# Patient Record
Sex: Female | Born: 1935 | Race: White | Hispanic: No | Marital: Married | Smoking: Former smoker
Health system: Southern US, Community
[De-identification: ages and names within clinical notes are randomized; demographics above are authoritative.]

## PROBLEM LIST (undated history)

## (undated) DIAGNOSIS — C189 Malignant neoplasm of colon, unspecified: Secondary | ICD-10-CM

## (undated) DIAGNOSIS — C8370 Burkitt lymphoma, unspecified site: Secondary | ICD-10-CM

## (undated) DIAGNOSIS — I2510 Atherosclerotic heart disease of native coronary artery without angina pectoris: Secondary | ICD-10-CM

## (undated) DIAGNOSIS — E785 Hyperlipidemia, unspecified: Secondary | ICD-10-CM

## (undated) DIAGNOSIS — I219 Acute myocardial infarction, unspecified: Secondary | ICD-10-CM

## (undated) DIAGNOSIS — I10 Essential (primary) hypertension: Secondary | ICD-10-CM

## (undated) DIAGNOSIS — Z9221 Personal history of antineoplastic chemotherapy: Secondary | ICD-10-CM

## (undated) DIAGNOSIS — C801 Malignant (primary) neoplasm, unspecified: Secondary | ICD-10-CM

## (undated) HISTORY — DX: Essential (primary) hypertension: I10

## (undated) HISTORY — DX: Malignant (primary) neoplasm, unspecified: C80.1

## (undated) HISTORY — DX: Hyperlipidemia, unspecified: E78.5

## (undated) HISTORY — DX: Personal history of antineoplastic chemotherapy: Z92.21

## (undated) HISTORY — PX: VAGINAL HYSTERECTOMY: SUR661

## (undated) HISTORY — DX: Burkitt lymphoma, unspecified site: C83.70

## (undated) HISTORY — DX: Atherosclerotic heart disease of native coronary artery without angina pectoris: I25.10

## (undated) HISTORY — DX: Acute myocardial infarction, unspecified: I21.9

## (undated) HISTORY — PX: PARTIAL COLECTOMY: SHX5273

## (undated) HISTORY — PX: LAPAROSCOPY: SHX197

## (undated) HISTORY — DX: Malignant neoplasm of colon, unspecified: C18.9

---

## 1998-04-17 ENCOUNTER — Other Ambulatory Visit: Admission: RE | Admit: 1998-04-17 | Discharge: 1998-04-17 | Payer: Self-pay | Admitting: Internal Medicine

## 1999-04-21 ENCOUNTER — Other Ambulatory Visit: Admission: RE | Admit: 1999-04-21 | Discharge: 1999-04-21 | Payer: Self-pay | Admitting: Internal Medicine

## 1999-12-19 ENCOUNTER — Other Ambulatory Visit: Admission: RE | Admit: 1999-12-19 | Discharge: 1999-12-19 | Payer: Self-pay | Admitting: Oncology

## 2000-04-28 ENCOUNTER — Other Ambulatory Visit: Admission: RE | Admit: 2000-04-28 | Discharge: 2000-04-28 | Payer: Self-pay | Admitting: Internal Medicine

## 2001-04-20 ENCOUNTER — Encounter: Admission: RE | Admit: 2001-04-20 | Discharge: 2001-04-20 | Payer: Self-pay | Admitting: Orthopedic Surgery

## 2001-04-20 ENCOUNTER — Encounter: Payer: Self-pay | Admitting: Orthopedic Surgery

## 2001-04-22 ENCOUNTER — Ambulatory Visit (HOSPITAL_BASED_OUTPATIENT_CLINIC_OR_DEPARTMENT_OTHER): Admission: RE | Admit: 2001-04-22 | Discharge: 2001-04-23 | Payer: Self-pay | Admitting: Orthopedic Surgery

## 2001-06-28 ENCOUNTER — Encounter: Admission: RE | Admit: 2001-06-28 | Discharge: 2001-07-28 | Payer: Self-pay | Admitting: Orthopedic Surgery

## 2002-09-29 ENCOUNTER — Ambulatory Visit (HOSPITAL_COMMUNITY): Admission: RE | Admit: 2002-09-29 | Discharge: 2002-09-29 | Payer: Self-pay | Admitting: Oncology

## 2002-09-29 ENCOUNTER — Encounter: Payer: Self-pay | Admitting: Oncology

## 2003-07-24 ENCOUNTER — Ambulatory Visit (HOSPITAL_COMMUNITY): Admission: RE | Admit: 2003-07-24 | Discharge: 2003-07-24 | Payer: Self-pay | Admitting: Oncology

## 2003-07-24 ENCOUNTER — Encounter: Payer: Self-pay | Admitting: Oncology

## 2003-08-13 ENCOUNTER — Encounter: Payer: Self-pay | Admitting: Oncology

## 2003-08-13 ENCOUNTER — Inpatient Hospital Stay (HOSPITAL_COMMUNITY): Admission: AD | Admit: 2003-08-13 | Discharge: 2003-08-24 | Payer: Self-pay | Admitting: Oncology

## 2003-08-14 ENCOUNTER — Encounter: Payer: Self-pay | Admitting: Oncology

## 2003-08-16 ENCOUNTER — Encounter: Payer: Self-pay | Admitting: Oncology

## 2003-08-17 ENCOUNTER — Encounter: Payer: Self-pay | Admitting: Oncology

## 2003-08-18 ENCOUNTER — Encounter: Payer: Self-pay | Admitting: Gastroenterology

## 2003-08-21 ENCOUNTER — Encounter: Payer: Self-pay | Admitting: Gastroenterology

## 2003-08-21 ENCOUNTER — Encounter: Payer: Self-pay | Admitting: Oncology

## 2003-08-23 ENCOUNTER — Encounter: Payer: Self-pay | Admitting: Oncology

## 2003-09-11 ENCOUNTER — Ambulatory Visit (HOSPITAL_COMMUNITY): Admission: RE | Admit: 2003-09-11 | Discharge: 2003-09-11 | Payer: Self-pay

## 2003-09-12 ENCOUNTER — Encounter: Payer: Self-pay | Admitting: Oncology

## 2003-09-12 ENCOUNTER — Inpatient Hospital Stay (HOSPITAL_COMMUNITY): Admission: EM | Admit: 2003-09-12 | Discharge: 2003-09-14 | Payer: Self-pay | Admitting: Oncology

## 2003-09-12 ENCOUNTER — Encounter: Payer: Self-pay | Admitting: Surgery

## 2003-09-15 ENCOUNTER — Inpatient Hospital Stay (HOSPITAL_COMMUNITY): Admission: RE | Admit: 2003-09-15 | Discharge: 2003-09-18 | Payer: Self-pay | Admitting: Interventional Radiology

## 2003-09-15 ENCOUNTER — Encounter: Payer: Self-pay | Admitting: General Surgery

## 2003-09-15 ENCOUNTER — Ambulatory Visit (HOSPITAL_COMMUNITY): Admission: RE | Admit: 2003-09-15 | Discharge: 2003-09-15 | Payer: Self-pay

## 2003-09-16 ENCOUNTER — Encounter: Payer: Self-pay | Admitting: General Surgery

## 2003-09-17 ENCOUNTER — Encounter: Payer: Self-pay | Admitting: General Surgery

## 2003-10-31 ENCOUNTER — Ambulatory Visit (HOSPITAL_COMMUNITY): Admission: RE | Admit: 2003-10-31 | Discharge: 2003-10-31 | Payer: Self-pay | Admitting: Oncology

## 2004-07-07 ENCOUNTER — Emergency Department (HOSPITAL_COMMUNITY): Admission: EM | Admit: 2004-07-07 | Discharge: 2004-07-07 | Payer: Self-pay | Admitting: Family Medicine

## 2004-10-22 ENCOUNTER — Ambulatory Visit: Payer: Self-pay | Admitting: Oncology

## 2005-01-02 ENCOUNTER — Ambulatory Visit: Payer: Self-pay | Admitting: Oncology

## 2005-03-06 ENCOUNTER — Ambulatory Visit: Payer: Self-pay | Admitting: Oncology

## 2005-06-15 ENCOUNTER — Ambulatory Visit: Payer: Self-pay | Admitting: Oncology

## 2005-08-24 ENCOUNTER — Ambulatory Visit: Payer: Self-pay | Admitting: Oncology

## 2006-01-05 ENCOUNTER — Ambulatory Visit: Payer: Self-pay | Admitting: Oncology

## 2006-03-16 ENCOUNTER — Ambulatory Visit: Payer: Self-pay | Admitting: Oncology

## 2006-03-16 LAB — COMPREHENSIVE METABOLIC PANEL
AST: 19 U/L (ref 0–37)
BUN: 32 mg/dL — ABNORMAL HIGH (ref 6–23)
CO2: 21 mEq/L (ref 19–32)
Calcium: 9.6 mg/dL (ref 8.4–10.5)
Chloride: 105 mEq/L (ref 96–112)
Creatinine, Ser: 0.9 mg/dL (ref 0.4–1.2)
Glucose, Bld: 111 mg/dL — ABNORMAL HIGH (ref 70–99)

## 2006-03-16 LAB — CBC WITH DIFFERENTIAL/PLATELET
Basophils Absolute: 0 10*3/uL (ref 0.0–0.1)
EOS%: 3.7 % (ref 0.0–7.0)
HGB: 13.5 g/dL (ref 11.6–15.9)
MCH: 32.5 pg (ref 26.0–34.0)
MCV: 96.1 fL (ref 81.0–101.0)
MONO%: 7.8 % (ref 0.0–13.0)
RBC: 4.15 10*6/uL (ref 3.70–5.32)
RDW: 13.5 % (ref 11.3–14.5)

## 2006-03-16 LAB — MORPHOLOGY

## 2006-06-08 ENCOUNTER — Ambulatory Visit: Payer: Self-pay | Admitting: Oncology

## 2006-06-14 LAB — CBC WITH DIFFERENTIAL/PLATELET
Basophils Absolute: 0 10*3/uL (ref 0.0–0.1)
EOS%: 2.5 % (ref 0.0–7.0)
Eosinophils Absolute: 0.1 10*3/uL (ref 0.0–0.5)
HGB: 14 g/dL (ref 11.6–15.9)
LYMPH%: 23.5 % (ref 14.0–48.0)
MCH: 33.7 pg (ref 26.0–34.0)
MCV: 97.4 fL (ref 81.0–101.0)
MONO%: 6.4 % (ref 0.0–13.0)
NEUT%: 67 % (ref 39.6–76.8)
Platelets: 246 10*3/uL (ref 145–400)
RDW: 13.2 % (ref 11.3–14.5)

## 2006-06-14 LAB — COMPREHENSIVE METABOLIC PANEL
Albumin: 4.6 g/dL (ref 3.5–5.2)
BUN: 23 mg/dL (ref 6–23)
CO2: 26 mEq/L (ref 19–32)
Calcium: 9.9 mg/dL (ref 8.4–10.5)
Chloride: 102 mEq/L (ref 96–112)
Creatinine, Ser: 0.88 mg/dL (ref 0.40–1.20)
Glucose, Bld: 87 mg/dL (ref 70–99)
Potassium: 4.3 mEq/L (ref 3.5–5.3)

## 2006-06-14 LAB — MORPHOLOGY

## 2006-07-28 ENCOUNTER — Ambulatory Visit: Payer: Self-pay | Admitting: Oncology

## 2006-08-02 LAB — MORPHOLOGY

## 2006-08-02 LAB — CBC WITH DIFFERENTIAL/PLATELET
BASO%: 0.4 % (ref 0.0–2.0)
EOS%: 3.6 % (ref 0.0–7.0)
MCH: 33.1 pg (ref 26.0–34.0)
MCHC: 33.9 g/dL (ref 32.0–36.0)
MONO#: 0.4 10*3/uL (ref 0.1–0.9)
NEUT%: 62.1 % (ref 39.6–76.8)
RBC: 4.03 10*6/uL (ref 3.70–5.32)
RDW: 13.6 % (ref 11.3–14.5)
WBC: 4.6 10*3/uL (ref 3.9–10.0)
lymph#: 1.2 10*3/uL (ref 0.9–3.3)

## 2006-08-02 LAB — COMPREHENSIVE METABOLIC PANEL
ALT: <8 U/L (ref 0–40)
AST: 15 U/L (ref 0–37)
Albumin: 4.3 g/dL (ref 3.5–5.2)
Alkaline Phosphatase: 48 U/L (ref 39–117)
Calcium: 9.7 mg/dL (ref 8.4–10.5)
Chloride: 105 mEq/L (ref 96–112)
Creatinine, Ser: 0.96 mg/dL (ref 0.40–1.20)
Potassium: 4.4 mEq/L (ref 3.5–5.3)

## 2006-09-24 ENCOUNTER — Ambulatory Visit: Payer: Self-pay | Admitting: Oncology

## 2006-09-28 LAB — COMPREHENSIVE METABOLIC PANEL
ALT: 13 U/L (ref 0–40)
Albumin: 4.2 g/dL (ref 3.5–5.2)
CO2: 21 mEq/L (ref 19–32)
Calcium: 9.2 mg/dL (ref 8.4–10.5)
Chloride: 107 mEq/L (ref 96–112)
Glucose, Bld: 109 mg/dL — ABNORMAL HIGH (ref 70–99)
Sodium: 138 mEq/L (ref 135–145)
Total Protein: 6 g/dL (ref 6.0–8.3)

## 2006-09-28 LAB — CBC WITH DIFFERENTIAL/PLATELET
Eosinophils Absolute: 0.2 10*3/uL (ref 0.0–0.5)
HCT: 37.7 % (ref 34.8–46.6)
LYMPH%: 20.8 % (ref 14.0–48.0)
MCHC: 35 g/dL (ref 32.0–36.0)
MCV: 93.6 fL (ref 81.0–101.0)
MONO#: 0.5 10*3/uL (ref 0.1–0.9)
MONO%: 8.1 % (ref 0.0–13.0)
NEUT#: 3.8 10*3/uL (ref 1.5–6.5)
NEUT%: 66.7 % (ref 39.6–76.8)
Platelets: 234 10*3/uL (ref 145–400)
RBC: 4.03 10*6/uL (ref 3.70–5.32)
WBC: 5.7 10*3/uL (ref 3.9–10.0)

## 2006-09-28 LAB — LACTATE DEHYDROGENASE: LDH: 133 U/L (ref 94–250)

## 2006-09-28 LAB — MORPHOLOGY: PLT EST: ADEQUATE

## 2006-10-18 LAB — CBC WITH DIFFERENTIAL/PLATELET
Eosinophils Absolute: 0.2 10*3/uL (ref 0.0–0.5)
MONO#: 0.4 10*3/uL (ref 0.1–0.9)
NEUT#: 4.5 10*3/uL (ref 1.5–6.5)
RBC: 4.02 10*6/uL (ref 3.70–5.32)
RDW: 12.6 % (ref 11.3–14.5)
WBC: 6.5 10*3/uL (ref 3.9–10.0)

## 2006-10-18 LAB — MORPHOLOGY: PLT EST: ADEQUATE

## 2006-12-14 HISTORY — PX: ANGIOPLASTY: SHX39

## 2006-12-29 ENCOUNTER — Ambulatory Visit: Payer: Self-pay | Admitting: Oncology

## 2006-12-31 ENCOUNTER — Inpatient Hospital Stay (HOSPITAL_COMMUNITY): Admission: EM | Admit: 2006-12-31 | Discharge: 2007-01-02 | Payer: Self-pay | Admitting: Emergency Medicine

## 2006-12-31 ENCOUNTER — Ambulatory Visit: Payer: Self-pay | Admitting: Cardiology

## 2007-01-12 ENCOUNTER — Ambulatory Visit: Payer: Self-pay | Admitting: Cardiology

## 2007-01-13 ENCOUNTER — Encounter (HOSPITAL_COMMUNITY): Admission: RE | Admit: 2007-01-13 | Discharge: 2007-04-13 | Payer: Self-pay | Admitting: Cardiology

## 2007-02-02 LAB — COMPREHENSIVE METABOLIC PANEL
BUN: 13 mg/dL (ref 6–23)
CO2: 23 mEq/L (ref 19–32)
Calcium: 9.1 mg/dL (ref 8.4–10.5)
Chloride: 108 mEq/L (ref 96–112)
Creatinine, Ser: 0.79 mg/dL (ref 0.40–1.20)
Glucose, Bld: 126 mg/dL — ABNORMAL HIGH (ref 70–99)
Total Bilirubin: 0.6 mg/dL (ref 0.3–1.2)

## 2007-02-02 LAB — CBC WITH DIFFERENTIAL/PLATELET
Basophils Absolute: 0 10*3/uL (ref 0.0–0.1)
Eosinophils Absolute: 0.2 10*3/uL (ref 0.0–0.5)
HCT: 35.4 % (ref 34.8–46.6)
HGB: 12 g/dL (ref 11.6–15.9)
MONO#: 0.4 10*3/uL (ref 0.1–0.9)
NEUT#: 4.2 10*3/uL (ref 1.5–6.5)
NEUT%: 69.8 % (ref 39.6–76.8)
WBC: 6 10*3/uL (ref 3.9–10.0)
lymph#: 1.1 10*3/uL (ref 0.9–3.3)

## 2007-02-02 LAB — MORPHOLOGY: PLT EST: ADEQUATE

## 2007-02-02 LAB — LACTATE DEHYDROGENASE: LDH: 96 U/L (ref 94–250)

## 2007-02-22 ENCOUNTER — Ambulatory Visit: Payer: Self-pay

## 2007-02-22 ENCOUNTER — Encounter: Payer: Self-pay | Admitting: Cardiology

## 2007-02-22 ENCOUNTER — Ambulatory Visit: Payer: Self-pay | Admitting: Cardiology

## 2007-02-22 LAB — CONVERTED CEMR LAB
AST: 17 units/L (ref 0–37)
Bilirubin, Direct: 0.1 mg/dL (ref 0.0–0.3)
HDL: 42.2 mg/dL (ref >39.0)
LDL Cholesterol: 77 mg/dL (ref 0–99)
Total CHOL/HDL Ratio: 3.3
Triglycerides: 101 mg/dL (ref 0–149)
VLDL: 20 mg/dL (ref 0–40)

## 2007-02-24 ENCOUNTER — Ambulatory Visit: Payer: Self-pay | Admitting: Cardiology

## 2007-03-15 ENCOUNTER — Ambulatory Visit: Payer: Self-pay | Admitting: Oncology

## 2007-03-18 LAB — COMPREHENSIVE METABOLIC PANEL
ALT: 10 U/L (ref 0–35)
Alkaline Phosphatase: 68 U/L (ref 39–117)
CO2: 22 mEq/L (ref 19–32)
Creatinine, Ser: 0.77 mg/dL (ref 0.40–1.20)
Glucose, Bld: 97 mg/dL (ref 70–99)
Sodium: 140 mEq/L (ref 135–145)
Total Bilirubin: 0.4 mg/dL (ref 0.3–1.2)

## 2007-03-18 LAB — MORPHOLOGY: PLT EST: ADEQUATE

## 2007-03-18 LAB — CBC WITH DIFFERENTIAL/PLATELET
Basophils Absolute: 0 10*3/uL (ref 0.0–0.1)
EOS%: 3.5 % (ref 0.0–7.0)
HCT: 35.7 % (ref 34.8–46.6)
HGB: 12.2 g/dL (ref 11.6–15.9)
MCH: 30.2 pg (ref 26.0–34.0)
MONO#: 0.5 10*3/uL (ref 0.1–0.9)
NEUT#: 4.8 10*3/uL (ref 1.5–6.5)
NEUT%: 73.5 % (ref 39.6–76.8)
RDW: 13.3 % (ref 11.3–14.5)
WBC: 6.6 10*3/uL (ref 3.9–10.0)
lymph#: 1 10*3/uL (ref 0.9–3.3)

## 2007-03-18 LAB — LACTATE DEHYDROGENASE: LDH: 116 U/L (ref 94–250)

## 2007-04-14 ENCOUNTER — Encounter (HOSPITAL_COMMUNITY): Admission: RE | Admit: 2007-04-14 | Discharge: 2007-04-29 | Payer: Self-pay | Admitting: Cardiology

## 2007-06-27 ENCOUNTER — Ambulatory Visit: Payer: Self-pay

## 2007-06-30 ENCOUNTER — Ambulatory Visit: Payer: Self-pay | Admitting: Cardiology

## 2007-06-30 ENCOUNTER — Ambulatory Visit: Payer: Self-pay | Admitting: Oncology

## 2007-07-04 LAB — COMPREHENSIVE METABOLIC PANEL
Alkaline Phosphatase: 62 U/L (ref 39–117)
Creatinine, Ser: 0.81 mg/dL (ref 0.40–1.20)
Glucose, Bld: 111 mg/dL — ABNORMAL HIGH (ref 70–99)
Sodium: 142 mEq/L (ref 135–145)
Total Bilirubin: 0.5 mg/dL (ref 0.3–1.2)
Total Protein: 5.5 g/dL — ABNORMAL LOW (ref 6.0–8.3)

## 2007-07-04 LAB — CBC WITH DIFFERENTIAL/PLATELET
BASO%: 0.5 % (ref 0.0–2.0)
Basophils Absolute: 0 10*3/uL (ref 0.0–0.1)
HCT: 33.5 % — ABNORMAL LOW (ref 34.8–46.6)
HGB: 11.1 g/dL — ABNORMAL LOW (ref 11.6–15.9)
MCHC: 33.2 g/dL (ref 32.0–36.0)
MONO#: 0.5 10*3/uL (ref 0.1–0.9)
NEUT%: 73.6 % (ref 39.6–76.8)
RDW: 15.8 % — ABNORMAL HIGH (ref 11.3–14.5)
WBC: 7.3 10*3/uL (ref 3.9–10.0)
lymph#: 1.1 10*3/uL (ref 0.9–3.3)

## 2007-07-04 LAB — MORPHOLOGY

## 2007-07-12 LAB — CBC WITH DIFFERENTIAL/PLATELET
Basophils Absolute: 0.1 10*3/uL (ref 0.0–0.1)
Eosinophils Absolute: 0.3 10*3/uL (ref 0.0–0.5)
HCT: 33 % — ABNORMAL LOW (ref 34.8–46.6)
HGB: 10.7 g/dL — ABNORMAL LOW (ref 11.6–15.9)
LYMPH%: 17.3 % (ref 14.0–48.0)
MONO#: 0.6 10*3/uL (ref 0.1–0.9)
NEUT#: 5.7 10*3/uL (ref 1.5–6.5)
NEUT%: 70.6 % (ref 39.6–76.8)
Platelets: 329 10*3/uL (ref 145–400)
WBC: 8.1 10*3/uL (ref 3.9–10.0)
lymph#: 1.4 10*3/uL (ref 0.9–3.3)

## 2007-07-12 LAB — FECAL OCCULT BLOOD, GUAIAC

## 2007-07-14 ENCOUNTER — Ambulatory Visit: Payer: Self-pay | Admitting: Internal Medicine

## 2007-07-18 ENCOUNTER — Encounter: Payer: Self-pay | Admitting: Internal Medicine

## 2007-07-18 ENCOUNTER — Ambulatory Visit: Payer: Self-pay | Admitting: Internal Medicine

## 2007-07-18 DIAGNOSIS — Z85038 Personal history of other malignant neoplasm of large intestine: Secondary | ICD-10-CM | POA: Insufficient documentation

## 2007-07-22 ENCOUNTER — Encounter (INDEPENDENT_AMBULATORY_CARE_PROVIDER_SITE_OTHER): Payer: Self-pay | Admitting: General Surgery

## 2007-07-22 ENCOUNTER — Ambulatory Visit: Payer: Self-pay | Admitting: Cardiology

## 2007-07-22 ENCOUNTER — Inpatient Hospital Stay (HOSPITAL_COMMUNITY): Admission: RE | Admit: 2007-07-22 | Discharge: 2007-07-28 | Payer: Self-pay | Admitting: General Surgery

## 2007-08-25 ENCOUNTER — Ambulatory Visit: Payer: Self-pay | Admitting: Oncology

## 2007-08-29 LAB — CBC WITH DIFFERENTIAL/PLATELET
Basophils Absolute: 0 10*3/uL (ref 0.0–0.1)
EOS%: 4.4 % (ref 0.0–7.0)
HCT: 38 % (ref 34.8–46.6)
HGB: 12.9 g/dL (ref 11.6–15.9)
MCH: 28.9 pg (ref 26.0–34.0)
MCV: 85.3 fL (ref 81.0–101.0)
NEUT%: 55.1 % (ref 39.6–76.8)
lymph#: 1.9 10*3/uL (ref 0.9–3.3)

## 2007-08-29 LAB — IRON AND TIBC
%SAT: 14 % — ABNORMAL LOW (ref 20–55)
UIBC: 280 ug/dL

## 2007-08-29 LAB — COMPREHENSIVE METABOLIC PANEL
AST: 16 U/L (ref 0–37)
BUN: 16 mg/dL (ref 6–23)
Calcium: 9.5 mg/dL (ref 8.4–10.5)
Chloride: 112 mEq/L (ref 96–112)
Creatinine, Ser: 0.82 mg/dL (ref 0.40–1.20)
Glucose, Bld: 87 mg/dL (ref 70–99)

## 2007-08-29 LAB — LACTATE DEHYDROGENASE: LDH: 132 U/L (ref 94–250)

## 2007-08-29 LAB — CEA: CEA: 0.6 ng/mL (ref 0.0–5.0)

## 2007-08-29 LAB — FERRITIN: Ferritin: 33 ng/mL (ref 10–291)

## 2007-09-06 LAB — CBC WITH DIFFERENTIAL/PLATELET
BASO%: 1.1 % (ref 0.0–2.0)
EOS%: 3.3 % (ref 0.0–7.0)
HCT: 39.2 % (ref 34.8–46.6)
MCHC: 32.7 g/dL (ref 32.0–36.0)
MONO#: 0.5 10*3/uL (ref 0.1–0.9)
NEUT%: 70.8 % (ref 39.6–76.8)
RBC: 4.51 10*6/uL (ref 3.70–5.32)
RDW: 16.3 % — ABNORMAL HIGH (ref 11.3–14.5)
WBC: 7.5 10*3/uL (ref 3.9–10.0)
lymph#: 1.4 10*3/uL (ref 0.9–3.3)

## 2007-09-12 LAB — CBC WITH DIFFERENTIAL/PLATELET
Basophils Absolute: 0.1 10*3/uL (ref 0.0–0.1)
Eosinophils Absolute: 0.2 10*3/uL (ref 0.0–0.5)
HCT: 38.2 % (ref 34.8–46.6)
HGB: 12.7 g/dL (ref 11.6–15.9)
MONO#: 0.7 10*3/uL (ref 0.1–0.9)
NEUT#: 4.9 10*3/uL (ref 1.5–6.5)
NEUT%: 68 % (ref 39.6–76.8)
RDW: 18 % — ABNORMAL HIGH (ref 11.3–14.5)
WBC: 7.2 10*3/uL (ref 3.9–10.0)
lymph#: 1.4 10*3/uL (ref 0.9–3.3)

## 2007-09-20 LAB — CBC WITH DIFFERENTIAL/PLATELET
Basophils Absolute: 0 10*3/uL (ref 0.0–0.1)
EOS%: 3 % (ref 0.0–7.0)
Eosinophils Absolute: 0.2 10*3/uL (ref 0.0–0.5)
HCT: 35.6 % (ref 34.8–46.6)
HGB: 12.1 g/dL (ref 11.6–15.9)
MCH: 30.1 pg (ref 26.0–34.0)
MCV: 88.3 fL (ref 81.0–101.0)
MONO%: 6.9 % (ref 0.0–13.0)
NEUT#: 4.4 10*3/uL (ref 1.5–6.5)
NEUT%: 72 % (ref 39.6–76.8)
RDW: 22.9 % — ABNORMAL HIGH (ref 11.3–14.5)

## 2007-09-20 LAB — COMPREHENSIVE METABOLIC PANEL
AST: 16 U/L (ref 0–37)
Albumin: 3.9 g/dL (ref 3.5–5.2)
Alkaline Phosphatase: 58 U/L (ref 39–117)
BUN: 11 mg/dL (ref 6–23)
Calcium: 8.9 mg/dL (ref 8.4–10.5)
Chloride: 107 mEq/L (ref 96–112)
Creatinine, Ser: 0.77 mg/dL (ref 0.40–1.20)
Glucose, Bld: 104 mg/dL — ABNORMAL HIGH (ref 70–99)
Potassium: 3.9 mEq/L (ref 3.5–5.3)

## 2007-10-12 ENCOUNTER — Ambulatory Visit: Payer: Self-pay | Admitting: Oncology

## 2007-10-14 LAB — COMPREHENSIVE METABOLIC PANEL
ALT: 24 U/L (ref 0–35)
Albumin: 4.3 g/dL (ref 3.5–5.2)
CO2: 21 mEq/L (ref 19–32)
Calcium: 9.6 mg/dL (ref 8.4–10.5)
Chloride: 111 mEq/L (ref 96–112)
Creatinine, Ser: 0.73 mg/dL (ref 0.40–1.20)
Sodium: 144 mEq/L (ref 135–145)
Total Protein: 6.1 g/dL (ref 6.0–8.3)

## 2007-10-14 LAB — CBC WITH DIFFERENTIAL/PLATELET
BASO%: 0.5 % (ref 0.0–2.0)
HCT: 39.4 % (ref 34.8–46.6)
HGB: 13.5 g/dL (ref 11.6–15.9)
MCHC: 34.2 g/dL (ref 32.0–36.0)
MONO#: 0.3 10*3/uL (ref 0.1–0.9)
NEUT%: 66.7 % (ref 39.6–76.8)
WBC: 5.5 10*3/uL (ref 3.9–10.0)
lymph#: 1.2 10*3/uL (ref 0.9–3.3)

## 2007-10-24 LAB — CBC WITH DIFFERENTIAL/PLATELET
BASO%: 1.2 % (ref 0.0–2.0)
MCHC: 33.5 g/dL (ref 32.0–36.0)
MONO#: 0.4 10*3/uL (ref 0.1–0.9)
RBC: 4.35 10*6/uL (ref 3.70–5.32)
RDW: 16.5 % — ABNORMAL HIGH (ref 11.3–14.5)
WBC: 5.4 10*3/uL (ref 3.9–10.0)
lymph#: 1.4 10*3/uL (ref 0.9–3.3)

## 2007-11-01 IMAGING — CR DG CHEST 2V
2 series · 2 of 2 positions shown · non-contrast
Comparison: 12/31/06.

CLINICAL DATA: 78 year-old transverse colon cancer, preop for colectomy.
 CHEST - 2 VIEW:

[view not recorded (1 of 2)]
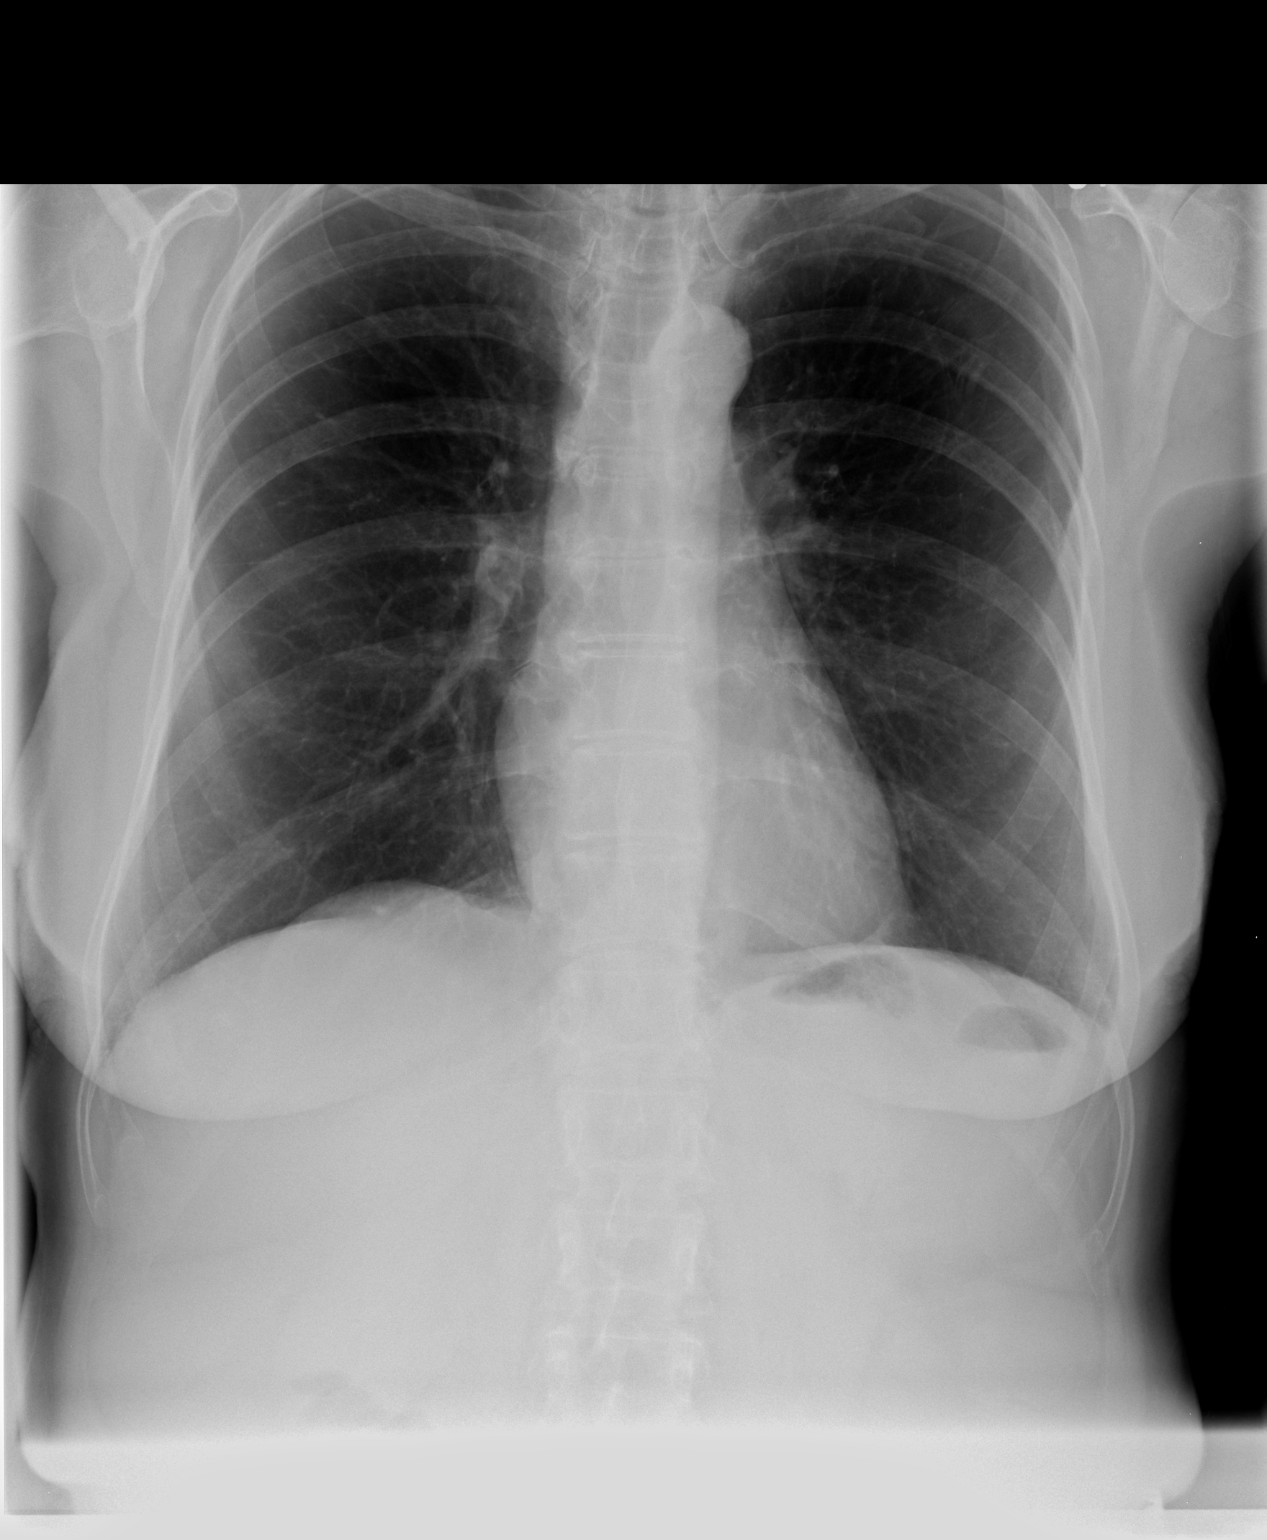

[view not recorded (2 of 2)]
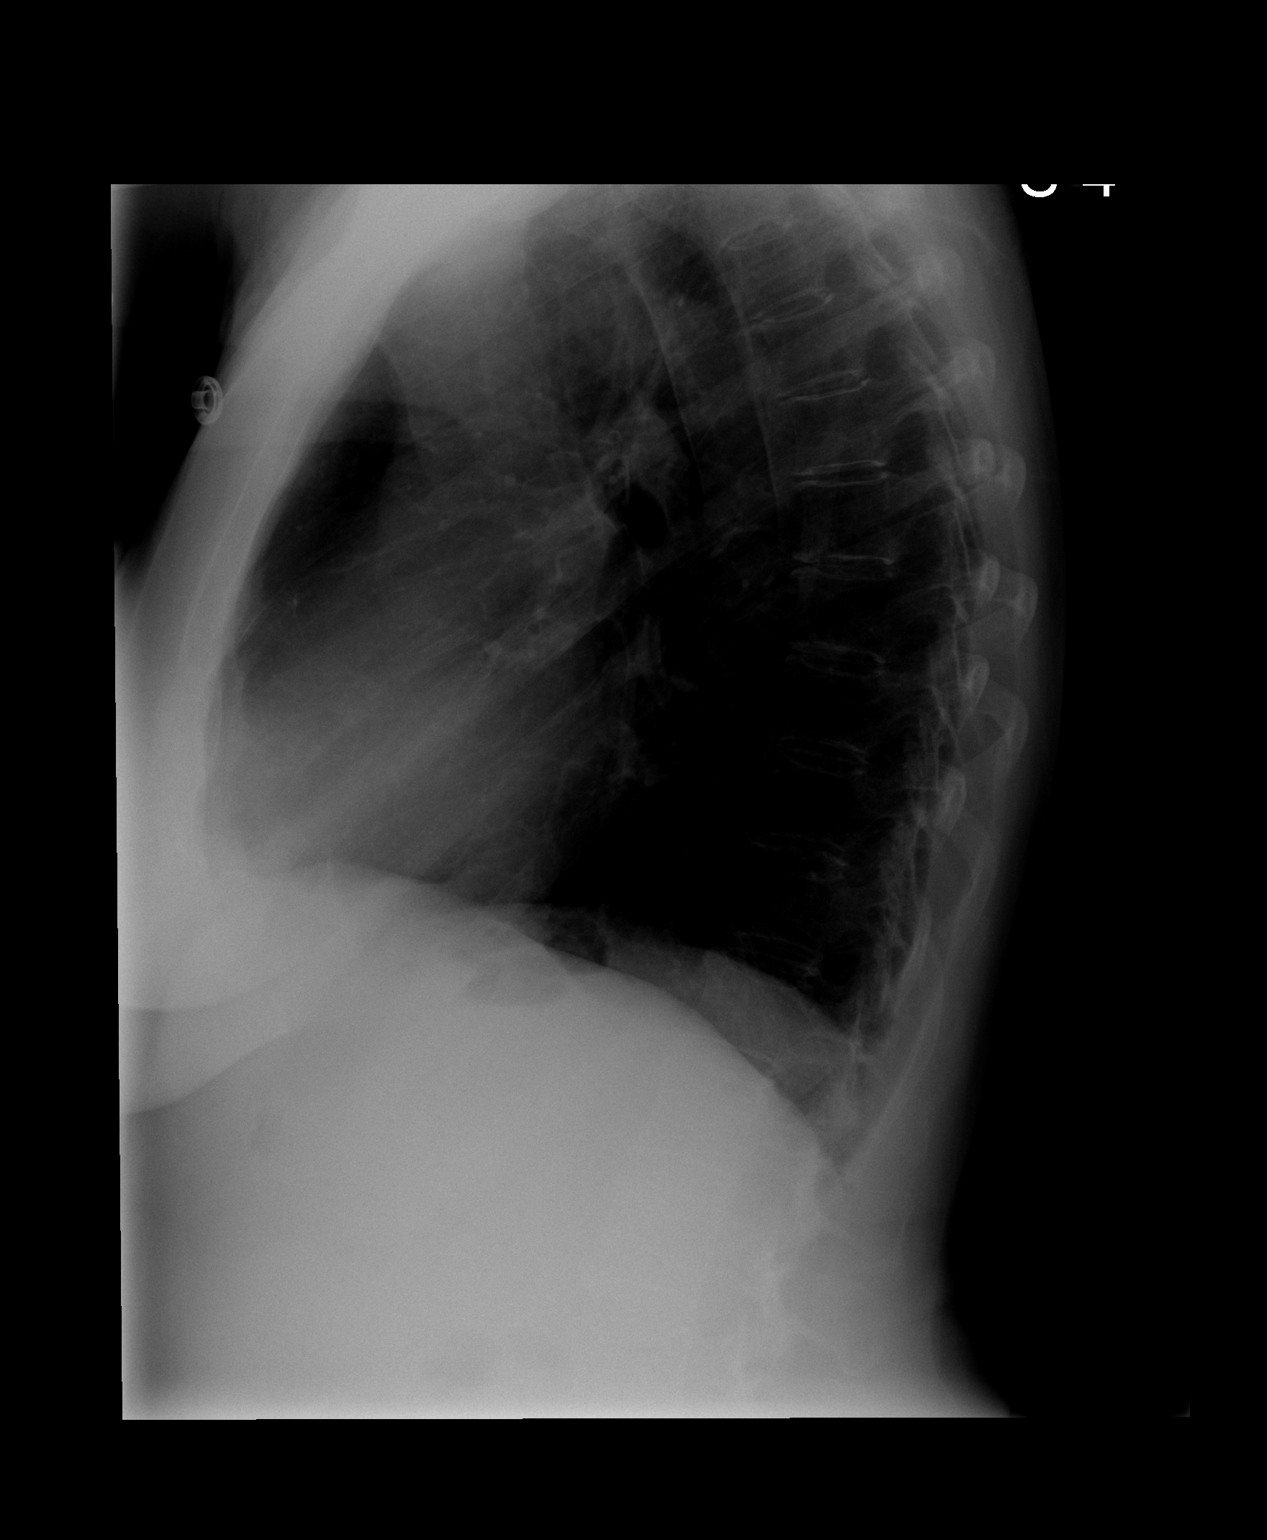

[2 of 2 positions shown; findings below may reference images not displayed]

FINDINGS: Cardiac silhouette, mediastinal and hilar contours are within normal limits and stable.  Mild changes of COPD.  Bony structures are unremarkable.
IMPRESSION: Mild stable changes of COPD.  No acute pulmonary findings, and no pulmonary masses or nodules.

## 2007-11-08 LAB — CBC WITH DIFFERENTIAL/PLATELET
BASO%: 1.7 % (ref 0.0–2.0)
Basophils Absolute: 0.1 10*3/uL (ref 0.0–0.1)
HCT: 42.2 % (ref 34.8–46.6)
HGB: 14.4 g/dL (ref 11.6–15.9)
MONO#: 0.4 10*3/uL (ref 0.1–0.9)
NEUT%: 53.5 % (ref 39.6–76.8)
RDW: 15.4 % — ABNORMAL HIGH (ref 11.3–14.5)
WBC: 5.2 10*3/uL (ref 3.9–10.0)
lymph#: 1.6 10*3/uL (ref 0.9–3.3)

## 2007-11-08 IMAGING — CR DG CHEST 1V PORT
1 series · 1 of 1 positions shown · non-contrast
Comparison: 07/24/07.

CLINICAL DATA: Congestion and cough.
 PORTABLE CHEST - 1 VIEW:

[view not recorded]
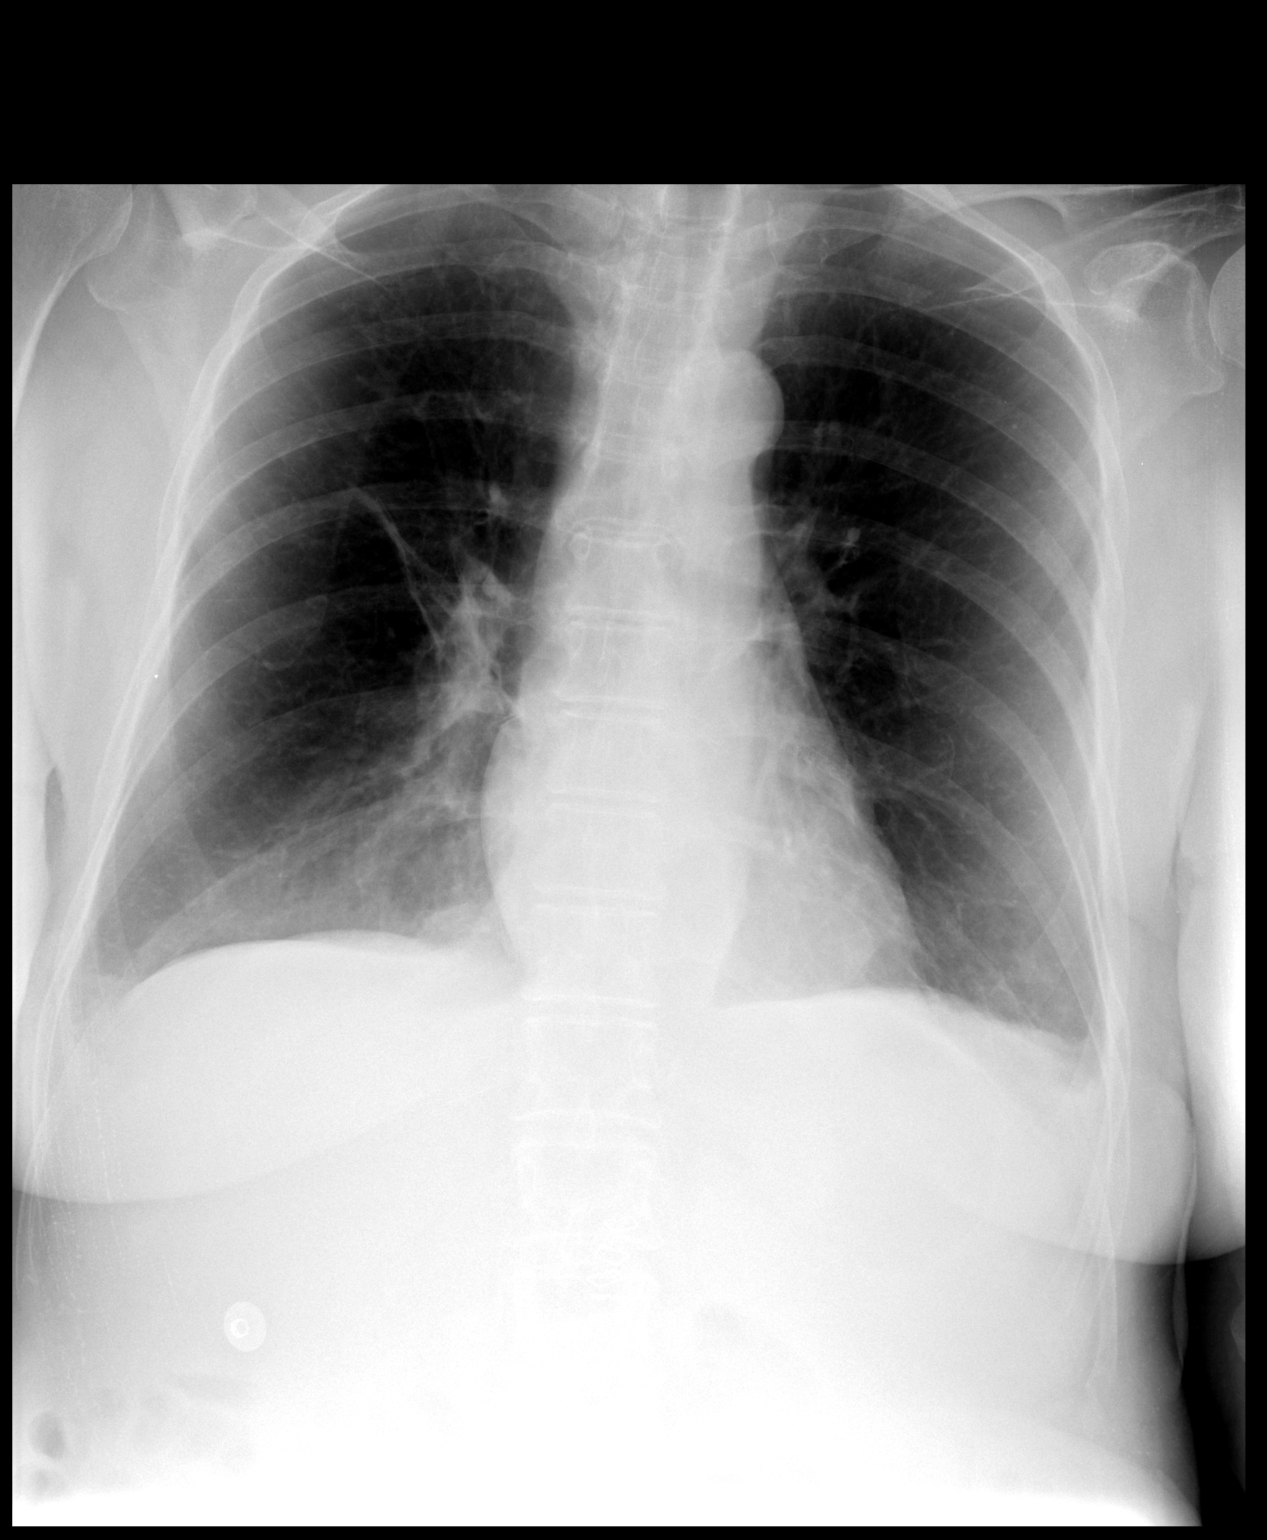

[1 of 1 positions shown; findings below may reference images not displayed]

FINDINGS: AP film at 9995 hours shows hyperinflation. The atelectasis or infiltrate at the right lung base persists.  There is perihilar atelectasis in the right mid lung. The left lung is clear. The Cardiopericardial silhouette is within normal limits for size. No evidence for pulmonary edema. Tiny bilateral pleural effusions are noted.
IMPRESSION: 1.  Hyperexpansion with right base atelectasis or infiltrate and tiny bilateral pleural effusions.
 2.  Right perihilar subsegmental atelectasis.

## 2007-11-22 LAB — CBC WITH DIFFERENTIAL/PLATELET
Basophils Absolute: 0.1 10*3/uL (ref 0.0–0.1)
EOS%: 5.1 % (ref 0.0–7.0)
Eosinophils Absolute: 0.2 10*3/uL (ref 0.0–0.5)
HCT: 40 % (ref 34.8–46.6)
HGB: 14.1 g/dL (ref 11.6–15.9)
MCH: 33.3 pg (ref 26.0–34.0)
MCV: 94.7 fL (ref 81.0–101.0)
NEUT#: 2.7 10*3/uL (ref 1.5–6.5)
NEUT%: 55.9 % (ref 39.6–76.8)
lymph#: 1.4 10*3/uL (ref 0.9–3.3)

## 2007-12-19 ENCOUNTER — Ambulatory Visit: Payer: Self-pay | Admitting: Oncology

## 2007-12-21 LAB — COMPREHENSIVE METABOLIC PANEL
ALT: 36 U/L — ABNORMAL HIGH (ref 0–35)
AST: 31 U/L (ref 0–37)
Calcium: 9.1 mg/dL (ref 8.4–10.5)
Chloride: 110 mEq/L (ref 96–112)
Creatinine, Ser: 0.8 mg/dL (ref 0.40–1.20)
Total Bilirubin: 0.6 mg/dL (ref 0.3–1.2)

## 2007-12-21 LAB — CBC WITH DIFFERENTIAL/PLATELET
BASO%: 2.2 % — ABNORMAL HIGH (ref 0.0–2.0)
EOS%: 5.8 % (ref 0.0–7.0)
HCT: 43.4 % (ref 34.8–46.6)
MCH: 34.3 pg — ABNORMAL HIGH (ref 26.0–34.0)
MCHC: 34.1 g/dL (ref 32.0–36.0)
NEUT%: 54.7 % (ref 39.6–76.8)
lymph#: 0.9 10*3/uL (ref 0.9–3.3)

## 2008-01-16 LAB — CBC WITH DIFFERENTIAL/PLATELET
BASO%: 1 % (ref 0.0–2.0)
Basophils Absolute: 0.1 10*3/uL (ref 0.0–0.1)
EOS%: 4.3 % (ref 0.0–7.0)
HGB: 14.2 g/dL (ref 11.6–15.9)
MCH: 35.3 pg — ABNORMAL HIGH (ref 26.0–34.0)
MCHC: 34.6 g/dL (ref 32.0–36.0)
MCV: 102.2 fL — ABNORMAL HIGH (ref 81.0–101.0)
MONO%: 9.5 % (ref 0.0–13.0)
RBC: 4.03 10*6/uL (ref 3.70–5.32)
RDW: 17.9 % — ABNORMAL HIGH (ref 11.3–14.5)
lymph#: 1.4 10*3/uL (ref 0.9–3.3)

## 2008-01-16 LAB — COMPREHENSIVE METABOLIC PANEL
ALT: 29 U/L (ref 0–35)
AST: 28 U/L (ref 0–37)
Albumin: 4.4 g/dL (ref 3.5–5.2)
Alkaline Phosphatase: 64 U/L (ref 39–117)
BUN: 17 mg/dL (ref 6–23)
Chloride: 109 mEq/L (ref 96–112)
Creatinine, Ser: 0.82 mg/dL (ref 0.40–1.20)
Potassium: 4.5 mEq/L (ref 3.5–5.3)

## 2008-01-17 ENCOUNTER — Ambulatory Visit: Payer: Self-pay | Admitting: Cardiology

## 2008-01-17 LAB — CONVERTED CEMR LAB
AST: 33 units/L (ref 0–37)
Albumin: 4.1 g/dL (ref 3.5–5.2)
Basophils Absolute: 0 10*3/uL (ref 0.0–0.1)
Chloride: 108 meq/L (ref 96–112)
Cholesterol: 166 mg/dL (ref 0–200)
Eosinophils Absolute: 0.2 10*3/uL (ref 0.0–0.6)
GFR calc Af Amer: 78 mL/min
GFR calc non Af Amer: 65 mL/min
HCT: 41.3 % (ref 36.0–46.0)
MCHC: 34.4 g/dL (ref 30.0–36.0)
MCV: 101.9 fL — ABNORMAL HIGH (ref 78.0–100.0)
Neutrophils Relative %: 54.9 % (ref 43.0–77.0)
Potassium: 4.2 meq/L (ref 3.5–5.1)
RBC: 4.05 M/uL (ref 3.87–5.11)
Sodium: 141 meq/L (ref 135–145)
Total CHOL/HDL Ratio: 3.2
Triglycerides: 91 mg/dL (ref 0–149)

## 2008-01-23 ENCOUNTER — Ambulatory Visit: Payer: Self-pay | Admitting: Cardiology

## 2008-02-06 ENCOUNTER — Ambulatory Visit: Payer: Self-pay | Admitting: Oncology

## 2008-02-06 LAB — COMPREHENSIVE METABOLIC PANEL
Albumin: 4.2 g/dL (ref 3.5–5.2)
Alkaline Phosphatase: 70 U/L (ref 39–117)
BUN: 11 mg/dL (ref 6–23)
Calcium: 8.9 mg/dL (ref 8.4–10.5)
Chloride: 106 mEq/L (ref 96–112)
Glucose, Bld: 129 mg/dL — ABNORMAL HIGH (ref 70–99)
Potassium: 4.1 mEq/L (ref 3.5–5.3)

## 2008-02-06 LAB — CBC WITH DIFFERENTIAL/PLATELET
Basophils Absolute: 0 10*3/uL (ref 0.0–0.1)
Eosinophils Absolute: 0.2 10*3/uL (ref 0.0–0.5)
HGB: 14.4 g/dL (ref 11.6–15.9)
MCV: 102.6 fL — ABNORMAL HIGH (ref 81.0–101.0)
MONO%: 10.6 % (ref 0.0–13.0)
NEUT#: 4.8 10*3/uL (ref 1.5–6.5)
RDW: 17.2 % — ABNORMAL HIGH (ref 11.3–14.5)

## 2008-03-12 LAB — CBC WITH DIFFERENTIAL/PLATELET
BASO%: 1.1 % (ref 0.0–2.0)
Basophils Absolute: 0.1 10*3/uL (ref 0.0–0.1)
EOS%: 6.2 % (ref 0.0–7.0)
HCT: 44.2 % (ref 34.8–46.6)
HGB: 15 g/dL (ref 11.6–15.9)
LYMPH%: 29.9 % (ref 14.0–48.0)
MCH: 35.8 pg — ABNORMAL HIGH (ref 26.0–34.0)
MCHC: 34 g/dL (ref 32.0–36.0)
MCV: 106 fL — ABNORMAL HIGH (ref 81.0–101.0)
MONO%: 8.4 % (ref 0.0–13.0)
NEUT%: 54.4 % (ref 39.6–76.8)
Platelets: 181 10*3/uL (ref 145–400)
lymph#: 1.6 10*3/uL (ref 0.9–3.3)

## 2008-03-22 LAB — CBC WITH DIFFERENTIAL/PLATELET
BASO%: 1.4 % (ref 0.0–2.0)
Basophils Absolute: 0.1 10*3/uL (ref 0.0–0.1)
HCT: 43 % (ref 34.8–46.6)
HGB: 14.8 g/dL (ref 11.6–15.9)
LYMPH%: 31.6 % (ref 14.0–48.0)
MCHC: 34.5 g/dL (ref 32.0–36.0)
MONO#: 0.5 10*3/uL (ref 0.1–0.9)
NEUT%: 54 % (ref 39.6–76.8)
Platelets: 206 10*3/uL (ref 145–400)
WBC: 5.2 10*3/uL (ref 3.9–10.0)
lymph#: 1.7 10*3/uL (ref 0.9–3.3)

## 2008-03-27 DIAGNOSIS — E785 Hyperlipidemia, unspecified: Secondary | ICD-10-CM

## 2008-03-27 DIAGNOSIS — I2510 Atherosclerotic heart disease of native coronary artery without angina pectoris: Secondary | ICD-10-CM | POA: Insufficient documentation

## 2008-03-27 DIAGNOSIS — I10 Essential (primary) hypertension: Secondary | ICD-10-CM | POA: Insufficient documentation

## 2008-03-27 DIAGNOSIS — Z9089 Acquired absence of other organs: Secondary | ICD-10-CM | POA: Insufficient documentation

## 2008-03-27 DIAGNOSIS — Z9861 Coronary angioplasty status: Secondary | ICD-10-CM

## 2008-03-27 DIAGNOSIS — C8589 Other specified types of non-Hodgkin lymphoma, extranodal and solid organ sites: Secondary | ICD-10-CM

## 2008-03-27 DIAGNOSIS — C8379 Burkitt lymphoma, extranodal and solid organ sites: Secondary | ICD-10-CM | POA: Insufficient documentation

## 2008-03-27 DIAGNOSIS — I219 Acute myocardial infarction, unspecified: Secondary | ICD-10-CM | POA: Insufficient documentation

## 2008-05-04 ENCOUNTER — Encounter: Admission: RE | Admit: 2008-05-04 | Discharge: 2008-05-04 | Payer: Self-pay | Admitting: Internal Medicine

## 2008-07-02 ENCOUNTER — Ambulatory Visit: Payer: Self-pay | Admitting: Oncology

## 2008-07-04 LAB — CBC WITH DIFFERENTIAL/PLATELET
BASO%: 0.7 % (ref 0.0–2.0)
EOS%: 4 % (ref 0.0–7.0)
MCH: 33.7 pg (ref 26.0–34.0)
MCHC: 34.7 g/dL (ref 32.0–36.0)
NEUT%: 60.7 % (ref 39.6–76.8)
RBC: 4.26 10*6/uL (ref 3.70–5.32)
RDW: 12.5 % (ref 11.3–14.5)
lymph#: 1.2 10*3/uL (ref 0.9–3.3)

## 2008-07-04 LAB — COMPREHENSIVE METABOLIC PANEL
ALT: 17 U/L (ref 0–35)
AST: 20 U/L (ref 0–37)
Calcium: 9.6 mg/dL (ref 8.4–10.5)
Chloride: 108 mEq/L (ref 96–112)
Creatinine, Ser: 0.88 mg/dL (ref 0.40–1.20)
Potassium: 4.5 mEq/L (ref 3.5–5.3)
Sodium: 141 mEq/L (ref 135–145)

## 2008-07-10 ENCOUNTER — Encounter: Payer: Self-pay | Admitting: Internal Medicine

## 2008-07-19 ENCOUNTER — Ambulatory Visit: Payer: Self-pay | Admitting: Internal Medicine

## 2008-08-09 ENCOUNTER — Encounter: Payer: Self-pay | Admitting: Internal Medicine

## 2008-08-09 ENCOUNTER — Ambulatory Visit: Payer: Self-pay | Admitting: Internal Medicine

## 2008-08-13 ENCOUNTER — Encounter: Payer: Self-pay | Admitting: Internal Medicine

## 2008-10-03 ENCOUNTER — Ambulatory Visit: Payer: Self-pay | Admitting: Oncology

## 2008-10-05 LAB — CBC WITH DIFFERENTIAL/PLATELET
BASO%: 1 % (ref 0.0–2.0)
EOS%: 4.7 % (ref 0.0–7.0)
HCT: 42.5 % (ref 34.8–46.6)
LYMPH%: 23.7 % (ref 14.0–48.0)
MCH: 33.3 pg (ref 26.0–34.0)
MCHC: 34.2 g/dL (ref 32.0–36.0)
MONO%: 6.3 % (ref 0.0–13.0)
NEUT%: 64.3 % (ref 39.6–76.8)
Platelets: 199 10*3/uL (ref 145–400)
RBC: 4.37 10*6/uL (ref 3.70–5.32)

## 2008-12-28 ENCOUNTER — Ambulatory Visit: Payer: Self-pay | Admitting: Oncology

## 2009-01-01 ENCOUNTER — Encounter: Payer: Self-pay | Admitting: Internal Medicine

## 2009-02-06 ENCOUNTER — Ambulatory Visit: Payer: Self-pay | Admitting: Cardiology

## 2009-02-06 LAB — CONVERTED CEMR LAB
ALT: 20 units/L (ref 0–35)
AST: 23 units/L (ref 0–37)
Basophils Absolute: 0.1 10*3/uL (ref 0.0–0.1)
Bilirubin, Direct: 0.2 mg/dL (ref 0.0–0.3)
CO2: 25 meq/L (ref 19–32)
Calcium: 9.5 mg/dL (ref 8.4–10.5)
Chloride: 108 meq/L (ref 96–112)
Cholesterol: 163 mg/dL (ref 0–200)
Eosinophils Absolute: 0.2 10*3/uL (ref 0.0–0.7)
GFR calc non Af Amer: 64 mL/min
HDL: 41.8 mg/dL (ref >39.0)
LDL Cholesterol: 101 mg/dL — ABNORMAL HIGH (ref 0–99)
Lymphocytes Relative: 27.1 % (ref 12.0–46.0)
MCHC: 34.7 g/dL (ref 30.0–36.0)
Neutrophils Relative %: 60.2 % (ref 43.0–77.0)
RDW: 12.1 % (ref 11.5–14.6)
Sodium: 141 meq/L (ref 135–145)
Total Bilirubin: 0.9 mg/dL (ref 0.3–1.2)
Triglycerides: 103 mg/dL (ref 0–149)
VLDL: 21 mg/dL (ref 0–40)

## 2009-02-11 ENCOUNTER — Ambulatory Visit: Payer: Self-pay | Admitting: Cardiology

## 2009-02-11 ENCOUNTER — Encounter: Payer: Self-pay | Admitting: Cardiology

## 2009-04-16 ENCOUNTER — Ambulatory Visit: Payer: Self-pay | Admitting: Cardiology

## 2009-04-18 LAB — CONVERTED CEMR LAB
ALT: 18 units/L (ref 0–35)
AST: 24 units/L (ref 0–37)
Bilirubin, Direct: 0.2 mg/dL (ref 0.0–0.3)
LDL Cholesterol: 80 mg/dL (ref 0–99)
Total CHOL/HDL Ratio: 3
Total Protein: 6.1 g/dL (ref 6.0–8.3)
Triglycerides: 85 mg/dL (ref 0.0–149.0)

## 2009-04-29 ENCOUNTER — Ambulatory Visit: Payer: Self-pay | Admitting: Oncology

## 2009-05-01 ENCOUNTER — Encounter: Payer: Self-pay | Admitting: Internal Medicine

## 2009-05-01 LAB — CBC WITH DIFFERENTIAL/PLATELET
Basophils Absolute: 0 10*3/uL (ref 0.0–0.1)
EOS%: 2.6 % (ref 0.0–7.0)
Eosinophils Absolute: 0.1 10*3/uL (ref 0.0–0.5)
HCT: 44.3 % (ref 34.8–46.6)
HGB: 14.9 g/dL (ref 11.6–15.9)
MCH: 33 pg (ref 25.1–34.0)
NEUT#: 3.7 10*3/uL (ref 1.5–6.5)
NEUT%: 66.7 % (ref 38.4–76.8)
RDW: 13.4 % (ref 11.2–14.5)
lymph#: 1.3 10*3/uL (ref 0.9–3.3)

## 2009-05-01 LAB — COMPREHENSIVE METABOLIC PANEL
AST: 18 U/L (ref 0–37)
Albumin: 4.6 g/dL (ref 3.5–5.2)
BUN: 21 mg/dL (ref 6–23)
CO2: 23 mEq/L (ref 19–32)
Calcium: 10 mg/dL (ref 8.4–10.5)
Chloride: 105 mEq/L (ref 96–112)
Creatinine, Ser: 0.97 mg/dL (ref 0.40–1.20)
Glucose, Bld: 121 mg/dL — ABNORMAL HIGH (ref 70–99)
Potassium: 4.8 mEq/L (ref 3.5–5.3)

## 2009-05-01 LAB — LACTATE DEHYDROGENASE: LDH: 139 U/L (ref 94–250)

## 2009-08-23 ENCOUNTER — Ambulatory Visit: Payer: Self-pay | Admitting: Oncology

## 2009-08-27 ENCOUNTER — Encounter: Payer: Self-pay | Admitting: Cardiology

## 2009-08-27 ENCOUNTER — Encounter: Payer: Self-pay | Admitting: Internal Medicine

## 2009-08-27 LAB — CBC WITH DIFFERENTIAL/PLATELET
BASO%: 0.7 % (ref 0.0–2.0)
EOS%: 4 % (ref 0.0–7.0)
HCT: 42.7 % (ref 34.8–46.6)
LYMPH%: 22.6 % (ref 14.0–49.7)
MCH: 32.9 pg (ref 25.1–34.0)
MCHC: 34 g/dL (ref 31.5–36.0)
MONO#: 0.3 10*3/uL (ref 0.1–0.9)
NEUT%: 67.9 % (ref 38.4–76.8)
RBC: 4.4 10*6/uL (ref 3.70–5.45)
WBC: 5.5 10*3/uL (ref 3.9–10.3)
lymph#: 1.2 10*3/uL (ref 0.9–3.3)

## 2009-08-27 LAB — COMPREHENSIVE METABOLIC PANEL
ALT: 20 U/L (ref 0–35)
Alkaline Phosphatase: 50 U/L (ref 39–117)
Creatinine, Ser: 0.94 mg/dL (ref 0.40–1.20)
Glucose, Bld: 136 mg/dL — ABNORMAL HIGH (ref 70–99)
Sodium: 140 mEq/L (ref 135–145)
Total Bilirubin: 0.7 mg/dL (ref 0.3–1.2)
Total Protein: 6.5 g/dL (ref 6.0–8.3)

## 2009-08-27 LAB — CEA: CEA: <0.5 ng/mL (ref 0.0–5.0)

## 2010-03-12 ENCOUNTER — Telehealth: Payer: Self-pay | Admitting: Cardiology

## 2010-03-20 ENCOUNTER — Ambulatory Visit: Payer: Self-pay | Admitting: Oncology

## 2010-03-24 ENCOUNTER — Encounter: Payer: Self-pay | Admitting: Internal Medicine

## 2010-03-24 LAB — LACTATE DEHYDROGENASE: LDH: 142 U/L (ref 94–250)

## 2010-03-24 LAB — CEA: CEA: 0.8 ng/mL (ref 0.0–5.0)

## 2010-03-24 LAB — CBC WITH DIFFERENTIAL/PLATELET
Basophils Absolute: 0 10*3/uL (ref 0.0–0.1)
Eosinophils Absolute: 0.2 10*3/uL (ref 0.0–0.5)
HGB: 14.6 g/dL (ref 11.6–15.9)
MCV: 98.3 fL (ref 79.5–101.0)
MONO#: 0.4 10*3/uL (ref 0.1–0.9)
MONO%: 7.8 % (ref 0.0–14.0)
NEUT#: 3.5 10*3/uL (ref 1.5–6.5)
RBC: 4.4 10*6/uL (ref 3.70–5.45)
RDW: 13 % (ref 11.2–14.5)
WBC: 5.5 10*3/uL (ref 3.9–10.3)
lymph#: 1.3 10*3/uL (ref 0.9–3.3)

## 2010-03-24 LAB — COMPREHENSIVE METABOLIC PANEL
Albumin: 4.5 g/dL (ref 3.5–5.2)
Alkaline Phosphatase: 52 U/L (ref 39–117)
BUN: 17 mg/dL (ref 6–23)
CO2: 24 mEq/L (ref 19–32)
Calcium: 10 mg/dL (ref 8.4–10.5)
Chloride: 105 mEq/L (ref 96–112)
Glucose, Bld: 86 mg/dL (ref 70–99)
Potassium: 4.5 mEq/L (ref 3.5–5.3)
Sodium: 141 mEq/L (ref 135–145)
Total Protein: 6 g/dL (ref 6.0–8.3)

## 2010-04-11 ENCOUNTER — Ambulatory Visit: Payer: Self-pay | Admitting: Cardiology

## 2010-04-29 ENCOUNTER — Telehealth: Payer: Self-pay | Admitting: Cardiology

## 2010-07-24 ENCOUNTER — Encounter (INDEPENDENT_AMBULATORY_CARE_PROVIDER_SITE_OTHER): Payer: Self-pay | Admitting: *Deleted

## 2010-09-23 ENCOUNTER — Encounter (INDEPENDENT_AMBULATORY_CARE_PROVIDER_SITE_OTHER): Payer: Self-pay | Admitting: *Deleted

## 2010-09-25 ENCOUNTER — Ambulatory Visit: Payer: Self-pay | Admitting: Internal Medicine

## 2010-10-02 ENCOUNTER — Ambulatory Visit: Payer: Self-pay | Admitting: Internal Medicine

## 2010-10-04 ENCOUNTER — Encounter: Payer: Self-pay | Admitting: Internal Medicine

## 2010-10-06 ENCOUNTER — Ambulatory Visit: Payer: Self-pay | Admitting: Oncology

## 2011-01-03 ENCOUNTER — Encounter: Payer: Self-pay | Admitting: Oncology

## 2011-01-13 NOTE — Progress Notes (Signed)
Summary: refill meds   Phone Note Refill Request Call back at Home Phone [phone] Message from:  Patient on Apr 29, 2010 10:49 AM  Refills Requested: Medication #1:  METOPROLOL SUCCINATE 25 MG XR24H-TAB Take one tablet by mouth daily [facility] [phone]   Method Requested: Fax to Mail Away Pharmacy Initial call taken by: Lorne Skeens,  Apr 29, 2010 10:49 AM    Prescriptions: METOPROLOL SUCCINATE 25 MG XR24H-TAB (METOPROLOL SUCCINATE) Take one tablet by mouth daily  #90 x 3   Entered by:   Burnett Kanaris, CNA   Authorized by:   Lenoria Farrier, MD, [facility]   Signed by:   Burnett Kanaris, CNA on 04/29/2010   Method used:   Electronically to        [facility] (mail-order)             ,          Ph: [phone]       Fax: [phone]   RxID:   [id number] METOPROLOL SUCCINATE 25 MG XR24H-TAB (METOPROLOL SUCCINATE) Take one tablet by mouth daily  #90 x 3   Entered by:   Burnett Kanaris, CNA   Authorized by:   Lenoria Farrier, MD, [facility]   Signed by:   Burnett Kanaris, CNA on 04/29/2010   Method used[facility]   [facility] [address] [phone]* (retail)       [address]       [place], [place]  [address]       Ph: [phone] or [phone]       Fax: [phone]   RxID:   [id number]   Appended Document: refill meds   metoprolol done daj    Prescriptions: METOPROLOL SUCCINATE 25 MG XR24H-TAB (METOPROLOL SUCCINATE) Take one tablet by mouth daily  #90 x 3   Entered by:   Burnett Kanaris, CNA   Authorized by:   Lenoria Farrier, MD, [facility]   Signed by:   Burnett Kanaris, CNA on 04/30/2010   Method used:   Electronically to        [facility]* (mail-order)             ,          Ph: [phone]       Fax: [phone]   RxID:   [id number]

## 2011-01-13 NOTE — Letter (Signed)
Summary: [facility] Instructions  [facility]  [address] [place], [place] [address]   Phone: [phone]  Fax: [phone]       [name]    75-16-1930    MRN: [id number]       Procedure Day /Date: Thursday 10/02/2010     Arrival Time: 7:30 am     Procedure Time: 8:30 am     Location of Procedure:                    _x _  [facility] (4th Floor)    PREPARATION FOR COLONOSCOPY WITH MIRALAX  Starting 5 days prior to your procedure Saturday 10/15 do not eat nuts, seeds, popcorn, corn, beans, peas,  salads, or any raw vegetables.  Do not take any fiber supplements (e.g. Metamucil, Citrucel, and Benefiber). ____________________________________________________________________________________________________   THE DAY BEFORE YOUR PROCEDURE         DATE: Wednesday 10/19  1   Drink clear liquids the entire day-NO SOLID FOOD  2   Do not drink anything colored red or purple.  Avoid juices with pulp.  No orange juice.  3   Drink at least 64 oz. (8 glasses) of fluid/clear liquids during the day to prevent dehydration and help the prep work efficiently.  CLEAR LIQUIDS INCLUDE: Water Jello Ice Popsicles Tea (sugar ok, no milk/cream) Powdered fruit flavored drinks Coffee (sugar ok, no milk/cream) Gatorade Juice: apple, white grape, white cranberry  Lemonade Clear bullion, consomm, broth Carbonated beverages (any kind) Strained chicken noodle soup Hard Candy  4   Mix the entire bottle of Miralax with 64 oz. of Gatorade/Powerade in the morning and put in the refrigerator to chill.  5   At 3:00 pm take 2 Dulcolax/Bisacodyl tablets.  6   At 4:30 pm take one Reglan/Metoclopramide tablet.  7  Starting at 5:00 pm drink one 8 oz glass of the Miralax mixture every 15-20 minutes until you have finished drinking the entire 64 oz.  You should finish drinking prep around 7:30 or 8:00 pm.  8   If you are nauseated, you may take the 2nd  Reglan/Metoclopramide tablet at 6:30 pm.        9    At 8:00 pm take 2 more DULCOLAX/Bisacodyl tablets.     THE DAY OF YOUR PROCEDURE      DATE:  Thursday 10/20  You may drink clear liquids until 6:30 am  (2 HOURS BEFORE PROCEDURE).   MEDICATION INSTRUCTIONS  Unless otherwise instructed, you should take regular prescription medications with a small sip of water as early as possible the morning of your procedure.           OTHER INSTRUCTIONS  You will need a responsible adult at least 75 years of age to accompany you and drive you home.   This person must remain in the waiting room during your procedure.  Wear loose fitting clothing that is easily removed.  Leave jewelry and other valuables at home.  However, you may wish to bring a book to read or an iPod/MP3 player to listen to music as you wait for your procedure to start.  Remove all body piercing jewelry and leave at home.  Total time from sign-in until discharge is approximately 2-3 hours.  You should go home directly after your procedure and rest.  You can resume normal activities the day after your procedure.  The day of your procedure you should not:   Drive  Make legal decisions   Operate machinery   Drink alcohol   Return to work  You will receive specific instructions about eating, activities and medications before you leave.   The above instructions have been reviewed and explained to me by   Ezra Sites RN  September 25, 2010 11:01 AM     I fully understand and can verbalize these instructions _____________________________ Date _______

## 2011-01-13 NOTE — Letter (Signed)
Summary: Colonoscopy Letter  [facility]  [address] [place], [place] [address]   Phone: [phone]  Fax: [phone]      July 24, 2010 MRN: [id number]   [facility] [address] [place], [place]  [address]   Dear Ms. [facility],   According to your medical record, it is time for you to schedule a Colonoscopy. The American Cancer Society recommends this procedure as a method to detect early colon cancer. Patients with a family history of colon cancer, or a personal history of colon polyps or inflammatory bowel disease are at increased risk.  This letter has beeen generated based on the recommendations made at the time of your procedure. If you feel that in your particular situation this may no longer apply, please contact our office.  Please call our office at [phone] to schedule this appointment or to update your records at your earliest convenience.  Thank you for cooperating with [place] to provide you with the very best care possible.   Sincerely,  Hedwig Morton. Juanda Chance, M.D.  [facility] [phone]

## 2011-01-13 NOTE — Miscellaneous (Signed)
Summary: [facility]  Clinical Lists Changes  Medications: Added new medication of MIRALAX   POWD (POLYETHYLENE GLYCOL 3350) As per prep  instructions. - Signed Added new medication of DULCOLAX 5 MG  TBEC (BISACODYL) Day before procedure take 2 at 3pm and 2 at 8pm. - Signed Added new medication of REGLAN 10 MG  TABS (METOCLOPRAMIDE HCL) As per prep instructions. - Signed Rx of MIRALAX   POWD (POLYETHYLENE GLYCOL 3350) As per prep  instructions.;  #255gm x 0;  Signed;  Entered by: Ezra Sites RN;  Authorized by: Hart Carwin MD;  Method [facility]: [facility] [address] [phone]*, [address] [place], [place], [place]  [address], Ph: [phone] or [phone], Fax: [phone] Rx of DULCOLAX 5 MG  TBEC (BISACODYL) Day before procedure take 2 at 3pm and 2 at 8pm.;  #4 x 0;  Signed;  Entered by: Ezra Sites RN;  Authorized by: Hart Carwin MD;  Method used: [facility]ically to [facility] [address] [phone]*, [address] [place], [place], [place]  [address], Ph: [phone] or [phone], Fax: [phone] Rx of REGLAN 10 MG  TABS (METOCLOPRAMIDE HCL) As per prep instructions.;  #2 x 0;  Signed;  Entered by: Ezra Sites RN;  Authorized by: Hart Carwin MD;  Method used: [facility]ically [facility] [address] [phone]*, [address] [place], [place], [place]  [address], Ph: [phone] or [phone], Fax: [phone] Observations: Added new observation of ALLERGY REV: Done (09/25/2010 10:38)    Prescriptions: REGLAN 10 MG  TABS (METOCLOPRAMIDE HCL) As per prep instructions.  #2 x 0   Entered by:   Ezra Sites RN   Authorized by:   Hart Carwin MD   Signed by:   Ezra Sites RN on 09/25/2010   Method used:   Electronically to        [facility] [address] [phone]* (retail)       [address]       [place], [place]  [address]       Ph: [phone] or [phone]       Fax: [phone]   RxID:   [id number] DULCOLAX 5 MG  TBEC  (BISACODYL) Day before procedure take 2 at 3pm and 2 at 8pm.  #4 x 0   Entered by:   Ezra Sites RN   Authorized by:   Hart Carwin MD   Signed by:   Ezra Sites RN on 09/25/2010   Method used:   Electronically to        [facility] [address] [phone]* (retail)       [address]       [place], [place]  [address]       Ph: [phone] or [phone]       Fax: [phone]   RxID:   [id number] MIRALAX   POWD (POLYETHYLENE GLYCOL 3350) As per prep  instructions.  #255gm x 0   Entered by:   Ezra Sites RN   Authorized by:   Hart Carwin MD   Signed by:   Ezra Sites RN on 09/25/2010   Method used:   Electronically to        [facility] [address] [phone]* (retail)       [address]       [place], [place]  [address]  Ph: [phone] or [phone]       Fax: [phone]   RxID:   [id number]

## 2011-01-13 NOTE — Letter (Signed)
[facility]   Imported By: Sherian Rein 04/15/2010 14:19:23  _____________________________________________________________________  External Attachment:    Type:   Image     Comment:   External Document

## 2011-01-13 NOTE — Progress Notes (Signed)
Summary: pt is out of meds  Medications Added CRESTOR 10 MG TABS (ROSUVASTATIN CALCIUM) Take one tablet by mouth daily.       Phone Note Refill Request Message from:  Patient on March 12, 2010 3:04 PM  Refills Requested: Medication #1:  Crestor 10mg  Crestor 10mg  send to [employer] [facility] [phone] pt is out of medication  Initial call taken by: Judie Grieve,  March 12, 2010 3:05 PM  Follow-up for Phone Call        tired to call pt but her phone line was busy i gave pt refills 1 year supply Follow-up by: Kem Parkinson,  March 14, 2010 2:15 PM    New/Updated Medications: CRESTOR 10 MG TABS (ROSUVASTATIN CALCIUM) Take one tablet by mouth daily. Prescriptions: CRESTOR 10 MG TABS (ROSUVASTATIN CALCIUM) Take one tablet by mouth daily.  #90 x 3   Entered by:   Kem Parkinson   Authorized by:   Lenoria Farrier, MD, [facility]   Signed by:   Kem Parkinson on 03/14/2010   Method used:   Electronically to        [facility]* (mail-order)             ,          Ph: [phone]       Fax: [phone]   RxID:   [id number]  pt calling back again she is out of meds please call this number [phone] option 2 this will get it to them sooner.

## 2011-01-13 NOTE — Procedures (Signed)
Summary: Colonoscopy  Patient: [name] Note: All result statuses are Final unless otherwise noted.  Tests: (1) Colonoscopy (COL)   COL Colonoscopy           DONE     [facility]     [address]     [place], [place]  [address]           COLONOSCOPY PROCEDURE REPORT           PATIENT:  [name], [name]  MR#:  [id number]     BIRTHDATE:  05/04/1929, 81 yrs. old  GENDER:  female     ENDOSCOPIST:  Hedwig Morton. Juanda Chance, MD     REF. BY:  Geoffry Paradise, M.D.     PROCEDURE DATE:  10/02/2010     PROCEDURE:  Colonoscopy 19147     ASA CLASS:  Class III     INDICATIONS:  surveillance and high-risk screening     adenoca.transverse colon 2008, s/p resection, last colon 2009     showed adenomatous polyp     MEDICATIONS:   Versed 10 mg, Fentanyl 100 mcg           DESCRIPTION OF PROCEDURE:   After the risks benefits and     alternatives of the procedure were thoroughly explained, informed     consent was obtained.  Digital rectal exam was performed and     revealed no rectal masses.   The LB [id number] endoscope     was introduced through the anus and advanced to the cecum, which     was identified by both the appendix and ileocecal valve, without     limitations.  The quality of the prep was excellent, using     MiraLax.  The instrument was then slowly withdrawn as the colon     was fully examined.     <<PROCEDUREIMAGES>>           FINDINGS:  Two polyps were found in the right colon. 1 mm and 3 mm     sessile polyps removed from 50 cm in the right colon The polyps     were removed using cold biopsy forceps (see image1, image2, and     image5).  A postoperative change was noted. s/p colon resection,     wide open anastomosis,  This was otherwise a normal examination of     the colon (see image6, image4, and image3).   Retroflexed views in     the rectum revealed no abnormalities.    The scope was then     withdrawn from the patient and the procedure completed.        COMPLICATIONS:  None     ENDOSCOPIC IMPRESSION:     1) Two polyps in the right colon     2) Postop change     3) Otherwise normal examination     s/p cold biopsy polypectomy, no recurrent ca,, normal tansverse     colostomy anastomosis     RECOMMENDATIONS:     1) Await pathology results     2) High fiber diet.     REPEAT EXAM:  In 3 year(s) for.  will discuss recall at the time     pending general condition           ______________________________     Hedwig Morton. Juanda Chance, MD           CC:  Jama Flavors, MD  n.     eSIGNED:   Hedwig Morton. Tee Richeson at 10/02/2010 08:33 AM           Dorothea Glassman, [id number]  Note: An exclamation mark (!) indicates a result that was not dispersed into the flowsheet. Document Creation Date: 10/02/2010 8:34 AM _______________________________________________________________________  (1) Order result status: Final Collection or observation date-time: 10/02/2010 08:22 Requested date-time:  Receipt date-time:  Reported date-time:  Referring Physician:   Ordering Physician: Lina Sar [id number]) Specimen Source:  Source: Launa Grill Order Number: [id number] Lab site:   Appended Document: Colonoscopy     Procedures Next Due Date:    Colonoscopy: 09/2013

## 2011-01-13 NOTE — Assessment & Plan Note (Signed)
Summary: f1y  Medications Added IMODIUM A-D 2 MG TABS (LOPERAMIDE HCL) as needed (traveling) ATIVAN 1 MG TABS (LORAZEPAM) at bedtime as needed LOMOTIL 2.5-0.025 MG TABS (DIPHENOXYLATE-ATROPINE) 2 tabs four times a day as needed      Allergies Added:   Visit Type:  Follow-up  CC:  no complaints.  History of Present Illness: She is 77 years old and return for followup management of CAD. In January 2007 she had a non-ST elevation MI and was treated with a Taxus stent to the LAD. She's done well since that time has had no recent chest pain shortness of breath or palpitations.  Her other problems include hypertension hyperlipidemia.  She also has had a lymphoma and is status post chemotherapy. She also has had colon cancer which was treated with resection and chemotherapy.  Current Medications (verified): 1)  Multivitamins   Tabs (Multiple Vitamin) .Marland Kitchen.. 1 Tab Daily 2)  Calcium Carbonate-Vitamin D 600-400 Mg-Unit  Tabs (Calcium Carbonate-Vitamin D) .... 2 Tabs Daily 3)  Metoprolol Succinate 25 Mg Xr24h-Tab (Metoprolol Succinate) .... Take One Tablet By Mouth Daily 4)  Nitroglycerin 0.4 Mg Subl (Nitroglycerin) .... One Tablet Under Tongue Every 5 Minutes As Needed For Chest Pain---May Repeat Times Three 5)  Crestor 10 Mg Tabs (Rosuvastatin Calcium) .... Take One Tablet By Mouth Daily. 6)  Imodium A-D 2 Mg Tabs (Loperamide Hcl) .... As Needed (Traveling) 7)  Ativan 1 Mg Tabs (Lorazepam) .... At Bedtime As Needed 8)  Lomotil 2.5-0.025 Mg Tabs (Diphenoxylate-Atropine) .... 2 Tabs Four Times A Day As Needed  Allergies (verified): 1)  ! Penicillin  Past History:  Past Medical History: Reviewed history from 02/10/2009 and no changes required. S/P CHEMOTHERAPY 1. Coronary artery disease status post non-ST elevation myocardial     infarction in January, 2007, with subsequent stenting of the     proximal LAD with a TAXUS drug-eluting stent, stable. 2. Normal left ventricular function. 3.  Hypertension. 4. Hyperlipidemia. 5. Lymphoma status post chemotherapy. 6. Cancer of the colon status post resection, currently being treated     with chemotherapy.   Review of Systems       ROS is negative except as outlined in HPI.   Vital Signs:  Patient profile:   77 year old female Height:      61 inches Weight:      128 pounds BMI:     24.27 Pulse rate:   57 / minute Pulse rhythm:   regular BP sitting:   144 / 80  (left arm) Cuff size:   regular  Vitals Entered By: Kem Parkinson (April 11, 2010 2:50 PM)  Physical Exam  Additional Exam:  Gen. Well-nourished, in no distress   Neck: No JVD, thyroid not enlarged, no carotid bruits Lungs: No tachypnea, clear without rales, rhonchi or wheezes Cardiovascular: Rhythm regular, PMI not displaced,  heart sounds  normal, no murmurs or gallops, no peripheral edema, pulses normal in all 4 extremities. Abdomen: BS normal, abdomen soft and non-tender without masses or organomegaly, no hepatosplenomegaly. MS: No deformities, no cyanosis or clubbing   Neuro:  No focal sns   Skin:  no lesions    Impression & Recommendations:  Problem # 1:  CORONARY ARTERY DISEASE (ICD-414.00) She had a non-ST elevation MI and a juggling stent to the LAD in 2007. She's had no recent chest pain this problem appears stable.    Her updated medication list for this problem includes:    Metoprolol Succinate 25 Mg Xr24h-tab (Metoprolol succinate) .Marland KitchenMarland KitchenMarland KitchenMarland Kitchen  Take one tablet by mouth daily    Nitroglycerin 0.4 Mg Subl (Nitroglycerin) ..... One tablet under tongue every 5 minutes as needed for chest pain---may repeat times three  Orders: EKG w/ Interpretation (93[address])  Problem # 2:  HYPERLIPIDEMIA (ICD-272.4) She has a very good lipid profile on Crestor. Did switch her from simvastatin to Crestor. Continue current therapy. Her updated medication list for this problem includes:    Crestor 10 Mg Tabs (Rosuvastatin calcium) .Marland Kitchen... Take one tablet by mouth  daily.  Orders: EKG w/ Interpretation (93[address])  Problem # 3:  ADENOCARCINOMA, COLON, HX OF (ICD-V10.05) She has had previous colon resection and chemotherapy for cancer of the colon. She is scheduled for a followup colonoscopy this summer.  Patient Instructions: 1)  Your physician wants you to follow-up in: 1 year with Dr. Gala Romney. You will receive a reminder letter in the mail two months in advance. If you don't receive a letter, please call our office to schedule the follow-up appointment.

## 2011-01-13 NOTE — Letter (Signed)
Summary: Patient Notice- Polyp Results  [facility]  [address] [place], [place] [address]   Phone: [phone]  Fax: [phone]        October 04, 2010 MRN: [id number]    [facility] [address] [place], [place]  [address]    Dear Ms. Reddix,  I am pleased to inform you that the colon polyp(s) removed during your recent colonoscopy was (were) found to be benign (no cancer detected) upon pathologic examination.The polyp cosisted of a polypoid tissue, ( not precancerous)  I recommend you have a repeat colonoscopy examination in 3_ years to look for recurrent polyps, as having colon polyps increases your risk for having recurrent polyps or even colon cancer in the future.  Should you develop new or worsening symptoms of abdominal pain, bowel habit changes or bleeding from the rectum or bowels, please schedule an evaluation with either your primary care physician or with me.  Additional information/recommendations:  _x_ No further action with gastroenterology is needed at this time. Please      follow-up with your primary care physician for your other healthcare      needs.  __ Please call [phone] to schedule a return visit to review your      situation.  __ Please keep your follow-up visit as already scheduled.  __ Continue treatment plan as outlined the day of your exam.  Please call us if you are having persistent problems or have questions about your condition that have not been fully answered at this time.  Sincerely,  Hart Carwin MD  This letter has been electronically signed by your physician.  Appended Document: Patient Notice- Polyp Results letter mailed

## 2011-04-28 NOTE — Assessment & Plan Note (Signed)
[facility]                            CARDIOLOGY OFFICE NOTE   Heidi, Hutchinson                      MRN:          [id number]  DATE:01/23/2008                            DOB:          Aug 15, 1929    PRIMARY CARE PHYSICIAN:  Dr. Bernadene Person.   CLINICAL HISTORY:  [name] returns for followup management of her  coronary heart disease.  She is 77 years old and in January of 2008 she  was hospitalized with a non-ST elevation myocardial infarction and  underwent stenting in the LAD with a TAXUS stent.  She has done quite  well since that time, has had no recurrent chest pain, shortness of  breath or palpitations.  She had a followup Myoview scan 6 months after  intervention which showed no evidence of ischemia.   PAST MEDICAL HISTORY:  Significant for:  1. A lymphoma for which she was treated with chemotherapy by Dr.      Darrold Span.  2. She also was diagnosed last summer with colon cancer and underwent      a partial colectomy by Dr. Glenna Fellows.  She is still      receiving chronic chemotherapy for that.   CURRENT MEDICATIONS:  Aspirin, Plavix, Zocor, Iron and Xeloda for her  colon cancer.   EXAMINATION:  The blood pressure was 144/88 and the pulse 63 and  regular.  There was no venous distention.  The carotid pulses were full  without bruits.  CHEST:  Clear.  Cardiac rhythm was regular.  No murmurs or gallops.  ABDOMEN:  Soft without organomegaly.  Peripheral pulses were full, there  was no peripheral edema.   IMPRESSION:  1. Coronary artery disease status post non-ST elevation myocardial      infarction in January, 2007, with subsequent stenting of the      proximal LAD with a TAXUS drug-eluting stent, stable.  2. Normal left ventricular function.  3. Hypertension.  4. Hyperlipidemia.  5. Lymphoma status post chemotherapy.  6. Cancer of the colon status post resection, currently being treated      with chemotherapy.   RECOMMENDATIONS:  I think [name] is doing quite well from the standpoint  of her heart.  She is having some occasional nosebleeds and some easy  bruising and asked about getting off the Plavix.  I told her we would  try her on 81 mg of aspirin with Plavix first.  If she has persistent  symptoms then will consider discontinuing the Plavix.  Her LDL was not  quite at target but she is  somewhat reluctant to take higher doses of statins, so will continue the  Zocor 20 unless she turns upward.  I will plan to see her back in  followup in a year.     Heidi Elvera Lennox Juanda Chance, MD, [facility]  Electronically Signed    BRB/MedQ  DD: 01/23/2008  DT: 01/24/2008  Job #: [id number]

## 2011-04-28 NOTE — Discharge Summary (Signed)
[name], [name]               ACCOUNT NO.:  [id number]   MEDICAL RECORD NO.:  [id number]          PATIENT TYPE:  INP   LOCATION:  6532                         FACILITY:  [facility]   PHYSICIAN:  Sharlet Salina T. Hoxworth, M.D.DATE OF BIRTH:  06-14-1929   DATE OF ADMISSION:  07/22/2007  DATE OF DISCHARGE:  07/28/2007                               DISCHARGE SUMMARY   DISCHARGE DIAGNOSIS:  Carcinoma of the transverse colon.   OPERATIONS/PROCEDURES:  Partial colectomy with anastomosis, by Dr.  Sharlet Salina T. Hoxworth, on 07/22/2007.   HISTORY OF PRESENT ILLNESS:  [name] is a 77 year old female, with  a significant history of non-Hodgkin's lymphoma and a Burkett's-like  transformation in 2004, requiring intensive chemotherapy at [facility] and  [place].  She is now in remission from this disease.  She is treated  with Rituxan approximately every three months.  At some point during  follow-up scans at [facility], at least in May of 2008, or possibly earlier, a  computed tomography scan of the abdomen revealed some apparent  thickening of the distal transverse colon.  She had no gastrointestinal  symptoms.  On recent follow-up, however, she was found to have heme-  positive stool, decreased hemoglobin, and a follow-up computed  tomography scan showed enlargement in the thickened area of the  transverse colon.  Colonoscopy was performed by Dr. Juanda Chance for this  reason, which showed a large fungating, partially obstructing mass in  the transverse colon.  Biopsies showed an invasive adenocarcinoma.  Following discussion in the office and mechanical antibiotic bowel  preparation at home, she is admitted for resection.   PAST MEDICAL HISTORY:  She had an acute myocardial infarction in January  of 2008, treated with a drug-eluting stent by Dr. Everardo Beals. Brodie.  She  is now on Plavix and aspirin, but has stopped the Plavix prior to this  procedure.  She also follows up her elevated cholesterol.   PAST  SURGICAL HISTORY:  Other surgeries include vaginal hysterectomy and  laparoscopy to stage her lymphoma.   MEDICATIONS:  1. Aspirin 325 mg daily.  2. Zocor 20 mg daily.  3. Toprol XL 25 mg daily.   ALLERGIES:  PENICILLIN.   SOCIAL HISTORY:  See the above history and physical.   FAMILY HISTORY:  See the above history and physical.   REVIEW OF SYSTEMS:  See the above history and physical.   PERTINENT PHYSICAL EXAMINATION:  GENERAL:  A thin, otherwise healthy-  appearing white female.  ABDOMEN:  The abdomen was benign, with no palpable masses or  organomegaly.   HOSPITAL COURSE:  The patient was admitted on the morning of her  procedure.  She underwent an uneventful partial colectomy, with a large  mobile mass found in the transverse colon.  She was initially observed  in the step-down unit due to her age and cardiac disease, but remained  quite stable.  The patient's postoperative hemoglobin was 9.2.  The  patient's pain was well-controlled.  She was started on a clear liquid  diet on the second postoperative day.  She began ambulating and was  transferred to the floor.  She did have one brief episode of fever early  on the second postoperative day to 101.6, and then it promptly returned  to normal.  The remainder of her course was benign.  She was followed by  Dr. Juanda Chance, Dr. Charlies Constable, while in the hospital.  Her Plavix was  resumed.  Her diet was advanced, which she tolerated well.  The patient  was discharged home on 07/28/2007, tolerating a regular diet.  Her  medications were the same as those on admission, plus Plavix and Vicodin  as needed for pain.  Final pathology revealed an invasive moderately-  differentiated to poorly-differentiated adenocarcinoma, 9.2 cm in  diameter, invading into the subserosal tissue, with 15 negative lymph  nodes.  Follow-up is in my office in two weeks, and with Dr. Darrold Span and  Dr. Juanda Chance as well.      Lorne Skeens. Hoxworth, M.D.   Electronically Signed     BTH/MEDQ  D:  09/21/2007  T:  09/22/2007  Job:  [id number]   cc:   Hedwig Morton. Juanda Chance, MD  [address]  [place]  [place] [address]   Lennis P. Darrold Span, M.D.  Fax: [phone]   Everardo Beals. Juanda Chance, MD, [facility]  [address]  [place], [place] [address]

## 2011-04-28 NOTE — Op Note (Signed)
[name], [name]               ACCOUNT NO.:  [id number]   MEDICAL RECORD NO.:  [id number]          PATIENT TYPE:  INP   LOCATION:  3313                         FACILITY:  [facility]   PHYSICIAN:  Sharlet Salina T. Hoxworth, M.D.DATE OF BIRTH:  Feb 26, 1929   DATE OF PROCEDURE:  07/22/2007  DATE OF DISCHARGE:                               OPERATIVE REPORT   PREOPERATIVE DIAGNOSIS:  Carcinoma of the transverse colon.   POSTOPERATIVE DIAGNOSIS:  Carcinoma of the transverse colon.   SURGICAL PROCEDURES:  Partial colectomy with anastomosis.   SURGEON:  Lorne Skeens. Hoxworth, M.D.   ASSISTANT:  Currie Paris, M.D.   ANESTHESIA:  General.   BRIEF HISTORY:  [name] is a 77 year old female with a significant  history of non-Hodgkin's lymphoma with a Burkitts-like transformation  status post chemotherapy.  In follow-up, she has had an area of  thickening seen in the transverse colon which was followed and then was  seen to enlarge and subsequently lead to colonoscopy earlier this week.  This revealed a large partially obstructing mass felt to be the distal  transverse colon and biopsies revealed adenocarcinoma.  We have  recommended proceeding with resection.  The nature of the procedure, its  indications, risks of bleeding, infection, cardiac complications, were  discussed and understood.  She has had a mechanical bowel prep and is  brought to the operating room for this procedure.   DESCRIPTION OF OPERATION:  The patient was brought to the operating room  and placed in the supine position on the operating table and general et  anesthesia was induced.  The abdomen was widely sterilely prepped and  draped.  A Foley catheter was placed.  Orogastric tube was placed.  She  received broad spectrum IV antibiotics.  The correct patient and  procedure were verified.  The abdomen was explored through an upper  midline incision and dissection carried down through the subcutaneous  tissue to the  midline fascia using cautery and the peritoneum entered  under direct vision.   On exploration, the colon was found to be quite extraordinarily mobile  with really no fixed splenic or hepatic flexures.  Essentially the  entire right transverse and most of the left colon could be easily  brought out onto the abdominal wall.  There was a large fairly bulky  mass which was at the mid to proximal transverse colon.  There was no  evidence of significant adenopathy or any perforation through the bowel  wall.  The remainder of the abdominal exploration was unremarkable.  There were some soft dark small masses of the liver consistent with  known hemangiomas.  It appeared we could get a nice wide resection  taking the majority of the transverse colon from essentially the distal  right colon over to the distal transverse colon.  There was a good  marginal artery of Drummond in the distal transverse colon which  appeared to provide good blood supply.  The area to be resected was  cleaned of mesentery and then the mesentery of the involved segment was  taken with the LigaSure device, taking the middle  colic artery near its  origin and additionally ligating this.  This did provide a nice wide  mesenteric resection.  The omentum was divided at the site of resection,  taking the omentum with the specimen.  After complete mobilization, a  functional end-to-end anastomosis was created with a single firing of  the GIA 75 mm stapler and the common enterotomy closed and the specimen  removed with a firing of the TA-60 stapler.  The anastomosis was widely  patent, appeared to have excellent blood supply, and was certainly under  no tension.  The mesenteric defect was closed with interrupted silks.  The abdomen was thoroughly irrigated and viscera returned to its  anatomic position.  Gloves and instruments were changed.  The midline  fascia was closed with running #1 PDS begun at either end of the  incision  and tied centrally.  The subcutaneous tissue was irrigated and  the skin closed with staples.  Sponge, instrument, and needle counts  were correct.  Dry, sterile dressings were applied.  The patient went to  the recovery room in good condition.      Lorne Skeens. Hoxworth, M.D.  Electronically Signed     BTH/MEDQ  D:  07/22/2007  T:  07/23/2007  Job:  [id number]   cc:   Hedwig Morton. Juanda Chance, MD  Lennis P. Darrold Span, M.D.

## 2011-04-28 NOTE — Assessment & Plan Note (Signed)
[facility]                            CARDIOLOGY OFFICE NOTE   CATLYN, SHIPTON                      MRN:          [id number]  DATE:06/30/2007                            DOB:          1929-11-18    PRIMARY CARE PHYSICIAN:  Dr. Geoffry Paradise.   ONCOLOGIST:  Dr. Jama Flavors.   CLINICAL HISTORY:  Oberia Beaudoin is 77 years old and returns for  followup management of her coronary artery heart disease. In January she  had a non ST elevation infarction and underwent stenting of a tight  lesion in the LAD with a TAXUS stent. She has done quite well since that  time with no recurrent chest pain, shortness of breath, or palpitations.  She had a Myoview scan prior to this visit which showed good exercise  tolerance, normal LV function, and no evidence of ischemia, with an  ejection fraction of 80%.   REVIEW OF SYSTEMS:  Positive for some slight nasal running. She  expressed some desire to get off some of her medications but does not  have any specific side effects related to her medications.   PAST MEDICAL HISTORY:  Significant for a lipoma for which she has  received chemotherapy and for which she is followed by Dr. Ronie Spies. She  may be scheduled to get Rituxan in the near future. Her other problems  include hyperlipidemia and hypertension.   CURRENT MEDICATIONS:  Aspirin, Plavix, Zocor, and Toprol.   PHYSICAL EXAMINATION:  Blood pressure is 115/80 and the pulse is 80 and  regular.  There was no venous distention. The carotid pulses were full without  bruits.  CHEST: Clear.  CARDIAC: Rhythm was regular. I could hear no murmurs or gallops.  ABDOMEN: Soft without organomegaly.  Peripheral pulses were full and there was no peripheral edema.   IMPRESSION:  1. Coronary artery disease, status post non ST elevation myocardial      infarction January 2007 with stenting of the proximal LAD using a      drug-eluting stent, now stable with negative  Myoview scan.  2. Normal LV function.  3. Hypertension.  4. Hyperlipidemia.  5. Lipoma, status post previous chemotherapy.   RECOMMENDATIONS:  I think Rebekah is doing quite well. She is anxious to  get off some of her medications and I think we might cut her Zocor back  to 20. She is scheduled to have a physical with Dr. Jacky Kindle in the next  3 to 6 months and she can get follow up lipid studies with him at that  time. I will plan to see her back in 6 months which will be a year post  intervention. We will  make a decision whether to stop or continue the Plavix at that time and  discussed some of the pros and cons of both the approaches.     Bruce Elvera Lennox Juanda Chance, MD, [facility]  Electronically Signed    BRB/MedQ  DD: 06/30/2007  DT: 07/01/2007  Job #: [id number]   cc:   Geoffry Paradise, M.D.  Lennis P. Darrold Span, M.D.

## 2011-04-28 NOTE — Consult Note (Signed)
NAMEJANNINE, [name]               ACCOUNT NO.:  [id number]   MEDICAL RECORD NO.:  [id number]          PATIENT TYPE:  INP   LOCATION:  6532                         FACILITY:  [facility]   PHYSICIAN:  Bruce R. Juanda Chance, MD, FACCDATE OF BIRTH:  1929-11-24   DATE OF CONSULTATION:  07/27/2007  DATE OF DISCHARGE:                                 CONSULTATION   CARDIAC CONSULTATION:   REASON FOR CONSULTATION:  Evaluation of cardiac arrhythmia.   CONSULTING PHYSICIAN:  Dr. Jaclynn Guarneri   CLINICAL HISTORY:  [name] is 77 years old and was recently found  to have a colonic mass by CT.  She underwent colonoscopy and was found  to have a cancer and underwent partial colectomy on July 22, 2007.  Postoperatively, she developed a short run of wide complex tachycardia  and had PVCs and was transferred to telemetry on 6500.  She had no  associated chest pain or shortness of breath and was not symptomatic  with her arrhythmia.   [name] had a non-ST-elevation myocardial infarction January of this  year and subsequently underwent stenting of the left anterior descending  artery with a drug-eluting stent.  She has done quite well since that  time with no recurrent symptoms.   PAST MEDICAL HISTORY:  Significant for:  1. Lymphoma which has been treated with chemotherapy by Dr. Darrold Span      and by [facility].  2. She also has a history of hyperlipidemia.   HOME MEDICATIONS:  Include Plavix, aspirin, Zocor and Toprol.   FAMILY HISTORY:  Negative for cardiovascular disease except for her  mother who had congestive heart failure in her 64's.   SOCIAL HISTORY:  She lives with her husband and has grown children.  She  works out regularly at a fitness center.   REVIEW OF SYSTEMS:  Positive for fatigue.   PHYSICAL EXAMINATION:  VITAL SIGNS:  Blood pressure is 102/50 and a  pulse of 66 and regular.  There was no venous distention.  The carotid  pulses were full and there were  no bruits.  CHEST:  Clear without rales or rhonchi.  CARDIAC:  Rhythm was regular.  The heart sounds were normal and no  murmurs or gallops.  ABDOMEN:  Soft and normal bowel sounds.  There was no  hepatosplenomegaly.  EXTREMITIES:  The peripheral pulses were full and there was no  peripheral edema.  MUSCULOSKELETAL:  Showed no deformities.  SKIN:  Warm and dry.  NEUROLOGIC EXAMINATION:  Showed no focal neurological signs.   IMPRESSION:  1. Postop status post partial colon resection for adenocarcinoma.  2. Postop wide complex tachycardia and PVCs.  3. Coronary artery disease status post prior non-ST-elevation      myocardial infarction and prior stenting of the drug-eluting stent      to the left anterior descending artery January of 2008.  4. Lymphoma status post chemotherapy in remission.  5. Hyperlipidemia.   RECOMMENDATIONS:  The potassium has been checked and she received some  potassium supplement.  She has been monitored on telemetry.  At this  point, her  arrhythmia appears to mostly have been resolved and I do not  think it needs any further treatment.  She has been off Plavix and  aspirin; I have recommended that she go back on aspirin which was  started on this morning, and I will discuss with Dr. Johna Sheriff about  starting her Plavix now that she is postop day five.  Will plan to see  her back in followup.      Bruce Elvera Lennox Juanda Chance, MD, [facility]  Electronically Signed     BRB/MEDQ  D:  07/27/2007  T:  07/27/2007  Job:  [id number]   cc:   Geoffry Paradise, M.D.  Lorne Skeens. Hoxworth, M.D.

## 2011-04-28 NOTE — Assessment & Plan Note (Signed)
[facility]                         GASTROENTEROLOGY OFFICE NOTE   [name], [name]                      MRN:          [id number]  DATE:07/14/2007                            DOB:          1929/10/21    HISTORY OF PRESENT ILLNESS:  [name] is a 77 year old white female  referred by Dr. Darrold Span for evaluation of heme positive stool,  abnormality on the CT scan pertaining to a thickening of a transverse  colon, as well as 2 out of 3 Hemoccult's positive, as well as dropping  hemoglobin from 12.2 to currently 10.7. I have seen [name] in 2003  for discussion of colorectal screening but at that time, she was just  about to be treated for recurrent lymphoma at [facility] and it was not an  appropriate time to do an elective screening colonoscopy. She still  remains quite asymptomatic as far as her bowel habits are concerned.  There has been some tendency towards constipation, which she attributes  to starting lipid lowering agents as well as Plavix, after percutaneous  coronary intervention by Dr. Charlies Constable in January 2008. She denies  any abdominal pain. Her general appetite has been decreased recently,  which has been reflecting on her weight loss from her usual 123 pounds  to currently 116 pounds. She denies any rectal bleeding or visible or  history of hemorrhoids. There is no family history of colon cancer. Her  initial diagnosis of B-cell lymphoma was made in 1992. She had then  transformation into Burkitt's lymphoma and underwent chemotherapy. She  is currently, as far as we know, in remission. Her last CT scan of the  abdomen, which she gets every 6 months, showed some interval increase in  the focal area of bowel thickening, in the interval posterior wall of  the mid to distal transverse colon.   MEDICATIONS:  Multiple vitamins, calcium, aspirin 325 mg p.o. daily,  Plavix 75 mg p.o. daily, Zocor 40 mg p.o. daily, Toprol XL 25 mg p.o.  daily, Ativan 1 mg p.r.n. She is also on nitroglycerin, Lorazepam,  Ambien, and hydrocodone p.r.n.   PAST MEDICAL HISTORY:  B-cell lymphoma, known ST elevation MI. She had  coronary angiogram and angioplasty in January 2008 with a Taxus stent  placement. She had a hysterectomy 40 years ago as well as tonsillectomy.   FAMILY HISTORY:  Heart disease in her mother.   SOCIAL HISTORY:  Married with 4 children. She is retired. She never  smoked and does not drink alcohol other than socially.   REVIEW OF SYSTEMS:  Positive for weight loss, eyeglasses, night sweats,  hearing aid.   PHYSICAL EXAMINATION:  VITAL SIGNS:  Blood pressure 130/68, pulse 84 and  weight 116 pounds.  GENERAL:  Alert and oriented. Appeared younger than her stated age.  HEENT:  Sclerae is nonicteric.  NECK:  Supple with no lymphadenopathy.  LUNGS:  Clear to auscultation with quiet precordium. Normal S1 and S2.  ABDOMEN:  Soft, relaxed, with palpable colon. No mass, no tenderness, no  pulsation.  RECTAL:  Normal rectal tone and perianal area. Small amount of Hemoccult  negative stool on the examination today.  EXTREMITIES:  The patient had Dupuytren's contractures both hands,  palmar erythema. No peripheral edema.   IMPRESSION:  A 77 year old white female with history of B-cell, non-  Hodgkin's lymphoma, status post transformation to Burkitt's lymphoma in  2003, status post extensive chemotherapy.  1. Non-ST wave elevation myocardial infarction in January 2008 with      percutaneous intervention with Taxus stent. She is currently on      Plavix and aspirin.  2. Heme positive stool, which could not be reproduced on today's      examination.  3. Abnormality on CT scan 1 year ago, as well as most recently      pertaining to non-specific thickening of the transverse colon. The      patient remains asymptomatic with only marked constipation.  4. Mild anemia with  progressive drop in hemoglobin over the past 6       months from 12.2 to 10.7.   PLAN:  I think the only way to clarify the abnormality on the CT scan is  to proceed with colonoscopy. There is really nothing on the physical  examination that would point me in any direction of either benign or  malignant disease, but since she has never had a colonoscopy, I think we  need to clarify the abnormality on the CT scan. [name] agrees to  proceed using routine colonoscopy prep. I have discussed her  anticoagulation status with my husband, Dr. Charlies Constable, and he would  prefer that we keep patient on Plavix and aspirin at the time of  procedure, which is no problem. The patient will be scheduled for  colonoscopy next 48 hours with routine colonoscopic prep. She will  continue all of her medications and we have discussed the conscious  sedation, prep, and the procedure itself.     Hedwig Morton. Juanda Chance, MD  Electronically Signed    DMB/MedQ  DD: 07/14/2007  DT: 07/14/2007  Job #: [id number]   cc:   Lennis P. Darrold Span, M.D.  Elta Guadeloupe, M.D.  Bruce Elvera Lennox Juanda Chance, MD, [facility]  Geoffry Paradise, M.D.

## 2011-05-01 NOTE — H&P (Signed)
[name], [name]               ACCOUNT NO.:  [id number]   MEDICAL RECORD NO.:  [id number]          PATIENT TYPE:  INP   LOCATION:  6533                         FACILITY:  [facility]   PHYSICIAN:  Bruce R. Juanda Chance, MD, FACCDATE OF BIRTH:  04/04/1929   DATE OF ADMISSION:  12/30/2006  DATE OF DISCHARGE:                              HISTORY & PHYSICAL   CODE STATUS:  Patient is a full code.   TIME SEEN:  Total visit time approximately 1 hour and 3 minutes.   PRIMARY CARDIOLOGIST:  The patient does not have a primary cardiologist.   CHIEF COMPLAINT:  Chest pain.   HISTORY OF PRESENT ILLNESS:  Patient and patient's husband were the  primary historians as well as the patient's daughter.  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  Patient notes she has had some  twinges of substernal chest pain, 3 times over the last 3 weeks, short-  lived, which were new for her.  On December 29, 2006, she noted chest  pain with a few steps into her usual walk at approximately 6 p.m., that  worsened to about an 8/10 chest pain where she had to stop her walk  prematurely.  Patient had no associated shortness of breath,  diaphoresis, nausea, and no radiation, but she notes the chest pain was  severe and worsened any time she walked.  At rest, she had pressure  sensation throughout.  The patient did go out that night and again she  had some substernal chest pain with exertion and pressure sensation at  rest.  Patient, on December 30, 2006, went to [facility] with her husband to  assist him while he was being seen in the clinic there and had  significant chest pain with exertion again.  Patient took an aspirin  when she got home and  called Dr. Juanda Chance (a family friend) who suggested  to her to call 911, and she did call 911 and she was given sublingual  nitroglycerin by EMS and her chest pain decreased to 4/10 from an 8/10  with 1 sublingual nitroglycerin.  She was also given aspirin.  This was  approximately 10 p.m.  She took her aspirin at home at approximately 3  p.m.  In the emergency room, patient notes chest pressure, but no pain  sensation and was given sublingual nitroglycerin with relief of the  pressure.   PAST MEDICAL HISTORY:  As above, also degenerative joint disease.   PAST SURGICAL HISTORY:  1. She had a hysterectomy with ovaries intact, for endometriosis.  2. She has a history of minor surgeries in the ankles and elbows.  3. She also had minor surgery related to her lymphoma.   ALLERGIES:  PENICILLIN, WHICH CAUSES A RASH.   MEDICATIONS:  1. Aspirin 325 mg p.r.n. for degenerative joint disease.  2. Benicar HCT 20/12.5 half tab a day.  3. Zocor 20 mg daily.   SOCIAL HISTORY:  She smoked between the ages of 34 and 42 years, 1/2  pack per day.  Rare alcohol, no illicit drug use.   FAMILY HISTORY:  Mother passed away secondary to complications of  congestive heart failure.   REVIEW OF SYSTEMS:  A 10-point review of systems dictated and negative  unless as stated above.   PHYSICAL EXAMINATION:  VITAL SIGNS:  Temperature 97.1, blood pressure  146/78, pulse 75, respiratory rate 16, sats 100%.  GENERAL:  She is awake, alert, and oriented in no acute distress.  HEENT:  Pupils equal, round, reactive to light.  Anicteric sclera.  Normocephalic, atraumatic.  Ears, nose, mouth, and throat:  The  posterior pharynx discloses no lesions.  NECK:  Supple with no masses or thyromegaly.  CARDIOVASCULAR:  On auscultation is regular rhythm and rate of heart  with no displacement of her point of maximal impulse.  No jugular venous  distension, no carotid bruits.  No abdominal bruits.  Pulses, dorsalis  pedis  posterior tibial are 2+ and symmetric.  No lower extremity edema.  LUNGS:  Clear to auscultation bilaterally.  ABDOMEN:  Soft, nontender, nondistended, no hepatosplenomegaly.  EXTREMITIES:  No signs of ischemia or petechiae.  NEUROLOGIC:  She is alert and oriented x4, cranial nerves II-XII are  grossly intact.  Strength and sensation are grossly intact.   LABORATORY DATA:  White count 5.9, hemoglobin 12.9, hematocrit 36.8,  platelets 275.  Troponin at 1:21 a.m. was 0.21.  Labs were delayed as  the patient initially refused the drawing of laboratory data.  CK-MB was  2.6, CK was 85.  Differential on her CBC shows neutrophils at 70%.  PTT  was 31.  INR was 1.0.  PT 13.1.  Sodium was 139, potassium 3.8, chloride  106, bicarb 24, glucose 108, BUN 20, creatinine 0.79.  ALT and AST were  unremarkable.  Alk phos was unremarkable.  Albumin was 3.7.  EKG was  normal sinus rhythm, normal axis, normal intervals, no ST-T changes, no  significant change from EKG dated September 2004.   ASSESSMENT/PLAN:  This is a patient with an unstable angina, mildly  positive troponins consistent with possible late presenting myocardial  infarction, although we can rule out congestive heart failure,  myocarditis, arrhythmias, or pulmonary embolism.  She will be placed on  aspirin and will continue Benicar.  She will also be given  anticoagulants and a Plavix load and have her NPO for possible left  heart catheterization in the morning.  She will be admitted to Dr.  Regino Schultze service.  We will also check a d-dimer to rule out possible  PTE.  We will follow with cardiac markers.      Darryl D. Prime, MD   Electronically Signed     ______________________________  Everardo Beals Juanda Chance, MD, [facility]    DDP/MEDQ  D:  12/31/2006  T:  12/31/2006  Job:  [id number]

## 2011-05-01 NOTE — Cardiovascular Report (Signed)
Hutchinson, Heidi               ACCOUNT NO.:  [id number]   MEDICAL RECORD NO.:  [id number]          PATIENT TYPE:  REC   LOCATION:  REHS                         FACILITY:  [facility]   PHYSICIAN:  Bruce R. Juanda Chance, MD, FACCDATE OF BIRTH:  1929-01-05   DATE OF PROCEDURE:  12/31/2006  DATE OF DISCHARGE:                            CARDIAC CATHETERIZATION   HISTORY:  [name] is a 77 year old and was admitted the day prior to  the procedure with chest pain and positive enzymes consistent with non-  ST-elevation infarction.  She also has a history of a lymphoma which has  been treated.  She is scheduled for evaluation angiography.   PROCEDURE:  The procedure was performed via the femoral arterial with  arterial sheath and 6-French preformed coronary catheters.  A front wall  arterial puncture was performed and Omnipaque contrast was used.  After  completion of the diagnostic study, we made decision to intervention on  the lesion in the left anterior descending artery.  The patient was  given antiemetics bolus infusion and was loaded with 300 mg Plavix and  was given aspirin.  We used a Q-3.5 6-French guiding catheter with side  holes and a Prowater wire.  We crossed the lesion with the wire without  difficulty.  We initially went in with 2.25 x 20 mm Maverick and  performed one inflation up to 8 atmospheres for 20 seconds.  We then  deployed a 2.5 x 20 mm Taxus stent, deploying this with one inflation of  15 atmospheres for 30 seconds.  We post dilated the stent with a 3.0 x  15 and 3.5 x 8-mm Quantum Maverick performing two inflations with the 3-  0 balloon up to 16 atmospheres for 20 seconds and one inflation with the  3-5 balloon up to 14 atmospheres for 20 seconds.   Final diagnostic studies were then performed through the guiding  catheter.  The patient tolerated the laboratory in satisfactory  condition.   RESULTS:  THE LEFT MAIN CORONARY:  Left main coronary artery was free of  significant disease.   LEFT ANTERIOR DESCENDING:  The left anterior descending artery gave rise  to small diagonal branch, large diagonal branch, a third small diagonal  branch and three septal perforators.  There was 95% stenosis of the  proximal LAD just after the first septal perforator and before the  second diagonal branch.  There was 80% narrowing of the proximal portion  of the diagonal branch.   THE CIRCUMFLEX ARTERY:  The circumflex artery gave rise to two small  marginal branches and two large posterolateral branches.  These vessels  were free of significant disease.   THE RIGHT CORONARY ARTERY:  The right coronary artery was moderate-sized  vessel gave rise to a ventricle branch of posterior branch and two  posterolateral branches.  There was 30% narrowing in the mid-right  coronary.  The rest of the vessel was free of the significant  obstruction.   LEFT VENTRICULOGRAM:  The left ventriculogram was performed in the RAO  projection.  __________  of the anterolateral wall.  The overall wall  motion was good with estimated fraction of 50%.   Following stenting of the lesion, the proximal LAD stenosis improved  from 95% to 0%.   CONCLUSION:  1. Coronary artery disease with recent non-ST elevation myocardial      infarction with 95% stenosis in the proximal LAD, 80% narrowing in      the second diagonal branch, no significant obstruction of      circumflex artery, 30% narrowing in the right coronary, and slight      anterolateral wall and apical wall hypokinesis with an estimated      fraction of 50%.  2. Successful PCI of the lesion in the proximal LAD using a Taxus drug-      eluting stent with improvement in narrowing from 95% to 0%.   DISPOSITION:  The patient to return to __________ for further  observation.      Bruce Elvera Lennox Juanda Chance, MD, [facility]  Electronically Signed     BRB/MEDQ  D:  04/13/2007  T:  04/13/2007  Job:  [id number]   cc:   Geoffry Paradise, M.D.

## 2011-05-08 ENCOUNTER — Ambulatory Visit: Payer: Self-pay | Admitting: Internal Medicine

## 2011-05-15 ENCOUNTER — Encounter: Payer: Self-pay | Admitting: Internal Medicine

## 2011-06-01 ENCOUNTER — Encounter: Payer: Self-pay | Admitting: Internal Medicine

## 2011-06-01 ENCOUNTER — Ambulatory Visit (INDEPENDENT_AMBULATORY_CARE_PROVIDER_SITE_OTHER): Payer: Medicare Other | Admitting: Internal Medicine

## 2011-06-01 VITALS — BP 112/62 | HR 69 | Ht 61.0 in | Wt 130.0 lb

## 2011-06-01 DIAGNOSIS — I2510 Atherosclerotic heart disease of native coronary artery without angina pectoris: Secondary | ICD-10-CM

## 2011-06-01 NOTE — Assessment & Plan Note (Signed)
No evidence of ischemia. Continue current regimen.   

## 2011-06-01 NOTE — Patient Instructions (Signed)
Your physician wants you to follow-up in: 1 year. You will receive a reminder letter in the mail two months in advance. If you don't receive a letter, please call our office to schedule the follow-up appointment.  

## 2011-06-01 NOTE — Progress Notes (Signed)
PCP: Dr. Jacky Kindle   HPI:  [name] is 77 year old woman previously folloeed by Dr. Juanda Chance for management of CAD. In January 2007 she had a non-ST elevation MI and was treated with a Taxus stent to the LAD. She's done well since that time has had no recent chest pain shortness of breath or palpitations.  Doing very well. Remains very active. Walks nearly every day with her husband.Does water aerobics. Yesterday walked 5 miles. No CP/SOB. Compliant with all meds. Crestor increased to 40mg  by Dr. Jacky Kindle.    ROS: All systems negative except as listed in HPI, PMH and Problem List.  Past Medical History  Diagnosis Date  . Status post chemotherapy   . Coronary artery disease     s/p NSTEMI in Jan 2007, with subsequent stenting of the prox LAD with a Taxus DES, stable  . Hypertension   . Hyperlipidemia   . Cancer     lymphomia, s/p chemotherapy  . Colon cancer     s/p resection, currently being treated with chemotherapy    Current Outpatient Prescriptions  Medication Sig Dispense Refill  . Calcium Carbonate-Vitamin D 600-400 MG-UNIT per tablet Take 2 tablets by mouth daily.        Marland Kitchen denosumab (PROLIA) 60 MG/ML SOLN Inject 60 mg into the skin every 6 (six) months.        . loperamide (IMODIUM A-D) 2 MG tablet Take 2 mg by mouth as needed. As needed (traveling)       . LORazepam (ATIVAN) 1 MG tablet Take 1 mg by mouth at bedtime as needed.        . metoprolol (TOPROL-XL) 200 MG 24 hr tablet Take 200 mg by mouth daily.        . multivitamin (THERAGRAN) per tablet Take 1 tablet by mouth daily.        . nitroGLYCERIN (NITROSTAT) 0.4 MG SL tablet Place 0.4 mg under the tongue every 5 (five) minutes as needed.        Marland Kitchen omeprazole (PRILOSEC) 20 MG capsule Take 20 mg by mouth daily as needed.        . ondansetron (ZOFRAN) 8 MG tablet Take by mouth every 8 (eight) hours as needed.        . rosuvastatin (CRESTOR) 40 MG tablet Take 40 mg by mouth daily.        Marland Kitchen DISCONTD: diphenoxylate-atropine  (LOMOTIL) 2.5-0.025 MG per tablet Take 2 tablets by mouth 4 (four) times daily as needed.        Marland Kitchen DISCONTD: rosuvastatin (CRESTOR) 10 MG tablet Take 10 mg by mouth daily.           PHYSICAL EXAM: Filed Vitals:   06/01/11 1606  BP: 112/62  Pulse: 69   General:  Well appearing. No resp difficulty HEENT: normal Neck: supple. JVP flat. Carotids 2+ bilaterally; no bruits. No lymphadenopathy or thryomegaly appreciated. Cor: PMI normal. Regular rate & rhythm. No rubs, gallops or murmurs. Lungs: clear Abdomen: soft, nontender, nondistended. No hepatosplenomegaly. No bruits or masses. Good bowel sounds. Extremities: no cyanosis, clubbing, rash, edema Neuro: alert & orientedx3, cranial nerves grossly intact. Moves all 4 extremities w/o difficulty. Affect pleasant.    ECG: NSR 69 No ST-T wave abnormalities.     ASSESSMENT & PLAN:

## 2011-06-02 ENCOUNTER — Encounter: Payer: Self-pay | Admitting: Internal Medicine

## 2011-08-10 ENCOUNTER — Other Ambulatory Visit: Payer: Self-pay | Admitting: Internal Medicine

## 2011-08-10 NOTE — Telephone Encounter (Signed)
Pt calling needing refill of nitro. Pt RX expired. Pt said she is leaving the country tomorrow and needs this refill called in and ready before she leaves.

## 2011-08-14 ENCOUNTER — Other Ambulatory Visit: Payer: Self-pay | Admitting: *Deleted

## 2011-08-14 MED ORDER — NITROGLYCERIN 0.4 MG SL SUBL: 0.4000 mg | SUBLINGUAL_TABLET | SUBLINGUAL | Status: AC | PRN

## 2011-09-28 LAB — CULTURE, BLOOD (ROUTINE X 2)

## 2011-09-28 LAB — URINALYSIS, ROUTINE W REFLEX MICROSCOPIC
Glucose, UA: NEGATIVE
Glucose, UA: NEGATIVE
Hgb urine dipstick: NEGATIVE
Ketones, ur: 15 — AB
Ketones, ur: NEGATIVE
Protein, ur: NEGATIVE
Specific Gravity, Urine: 1.024
pH: 5.5

## 2011-09-28 LAB — CBC
HCT: 24.1 — ABNORMAL LOW
HCT: 25.3 — ABNORMAL LOW
HCT: 28.1 — ABNORMAL LOW
HCT: 35.7 — ABNORMAL LOW
Hemoglobin: 8.4 — ABNORMAL LOW
Hemoglobin: 8.4 — ABNORMAL LOW
Hemoglobin: 9.2 — ABNORMAL LOW
MCHC: 32.8
MCHC: 32.8
MCHC: 33.3
MCV: 81.2
MCV: 82.9
Platelets: 223
Platelets: 324
RBC: 3.11 — ABNORMAL LOW
RBC: 3.64 — ABNORMAL LOW
RDW: 15.4 — ABNORMAL HIGH
RDW: 15.5 — ABNORMAL HIGH
RDW: 15.6 — ABNORMAL HIGH
RDW: 15.7 — ABNORMAL HIGH
RDW: 15.8 — ABNORMAL HIGH

## 2011-09-28 LAB — BASIC METABOLIC PANEL
BUN: 6
BUN: <1 — ABNORMAL LOW
CO2: 24
CO2: 26
Calcium: 7.5 — ABNORMAL LOW
Calcium: 8.2 — ABNORMAL LOW
Calcium: 8.3 — ABNORMAL LOW
Chloride: 111
Creatinine, Ser: 0.57
GFR calc Af Amer: >60
GFR calc non Af Amer: >60
Glucose, Bld: 111 — ABNORMAL HIGH
Glucose, Bld: 122 — ABNORMAL HIGH
Glucose, Bld: 146 — ABNORMAL HIGH
Potassium: 3.4 — ABNORMAL LOW
Potassium: 4.2

## 2011-09-28 LAB — URINE MICROSCOPIC-ADD ON

## 2011-09-28 LAB — DIFFERENTIAL
Lymphocytes Relative: 11 — ABNORMAL LOW
Monocytes Absolute: 0.7
Monocytes Relative: 6
Neutro Abs: 9.8 — ABNORMAL HIGH

## 2011-09-28 LAB — COMPREHENSIVE METABOLIC PANEL
Albumin: 3.3 — ABNORMAL LOW
BUN: 10
Calcium: 9.2
Creatinine, Ser: 0.78
Total Protein: 5.7 — ABNORMAL LOW

## 2011-10-05 ENCOUNTER — Other Ambulatory Visit: Payer: Self-pay | Admitting: Oncology

## 2011-10-05 ENCOUNTER — Encounter (HOSPITAL_BASED_OUTPATIENT_CLINIC_OR_DEPARTMENT_OTHER): Payer: Medicare Other | Admitting: Oncology

## 2011-10-05 DIAGNOSIS — C184 Malignant neoplasm of transverse colon: Secondary | ICD-10-CM

## 2011-10-05 DIAGNOSIS — C8378 Burkitt lymphoma, lymph nodes of multiple sites: Secondary | ICD-10-CM

## 2011-10-05 LAB — COMPREHENSIVE METABOLIC PANEL
Albumin: 4.3 g/dL (ref 3.5–5.2)
BUN: 14 mg/dL (ref 6–23)
CO2: 25 mEq/L (ref 19–32)
Calcium: 8.9 mg/dL (ref 8.4–10.5)
Chloride: 108 mEq/L (ref 96–112)
Glucose, Bld: 97 mg/dL (ref 70–99)
Potassium: 4.4 mEq/L (ref 3.5–5.3)
Total Protein: 5.9 g/dL — ABNORMAL LOW (ref 6.0–8.3)

## 2011-10-05 LAB — CBC WITH DIFFERENTIAL/PLATELET
Basophils Absolute: 0 10*3/uL (ref 0.0–0.1)
Eosinophils Absolute: 0.1 10*3/uL (ref 0.0–0.5)
HGB: 14.2 g/dL (ref 11.6–15.9)
MCV: 96.6 fL (ref 79.5–101.0)
MONO#: 0.3 10*3/uL (ref 0.1–0.9)
NEUT#: 2.8 10*3/uL (ref 1.5–6.5)
RDW: 13.2 % (ref 11.2–14.5)
WBC: 4.6 10*3/uL (ref 3.9–10.3)
lymph#: 1.3 10*3/uL (ref 0.9–3.3)

## 2011-10-05 LAB — CEA: CEA: <0.5 ng/mL (ref 0.0–5.0)

## 2011-12-31 DIAGNOSIS — M810 Age-related osteoporosis without current pathological fracture: Secondary | ICD-10-CM | POA: Diagnosis not present

## 2012-02-08 DIAGNOSIS — C8299 Follicular lymphoma, unspecified, extranodal and solid organ sites: Secondary | ICD-10-CM | POA: Diagnosis not present

## 2012-02-08 DIAGNOSIS — C8379 Burkitt lymphoma, extranodal and solid organ sites: Secondary | ICD-10-CM | POA: Diagnosis not present

## 2012-02-08 DIAGNOSIS — Z79899 Other long term (current) drug therapy: Secondary | ICD-10-CM | POA: Diagnosis not present

## 2012-04-04 ENCOUNTER — Other Ambulatory Visit: Payer: Self-pay

## 2012-04-04 ENCOUNTER — Ambulatory Visit: Payer: Medicare Other | Admitting: Oncology

## 2012-04-04 ENCOUNTER — Other Ambulatory Visit: Payer: Medicare Other | Admitting: Lab

## 2012-04-04 NOTE — Progress Notes (Signed)
Onc tx schedule sent for FTKA today. 

## 2012-04-08 ENCOUNTER — Telehealth: Payer: Self-pay | Admitting: Oncology

## 2012-04-08 NOTE — Telephone Encounter (Signed)
Per 4/22 pof called pt to r/s missed appt. Not able to reach pt or lm. Mailed letter re missed appt asking that pt call us to r/s.

## 2012-04-12 DIAGNOSIS — E785 Hyperlipidemia, unspecified: Secondary | ICD-10-CM | POA: Diagnosis not present

## 2012-04-12 DIAGNOSIS — I10 Essential (primary) hypertension: Secondary | ICD-10-CM | POA: Diagnosis not present

## 2012-04-12 DIAGNOSIS — I259 Chronic ischemic heart disease, unspecified: Secondary | ICD-10-CM | POA: Diagnosis not present

## 2012-04-12 DIAGNOSIS — K219 Gastro-esophageal reflux disease without esophagitis: Secondary | ICD-10-CM | POA: Diagnosis not present

## 2012-04-15 ENCOUNTER — Telehealth: Payer: Self-pay | Admitting: Medical Oncology

## 2012-04-15 ENCOUNTER — Telehealth: Payer: Self-pay | Admitting: Oncology

## 2012-04-15 NOTE — Telephone Encounter (Signed)
Pt called today re letter I sent asking that she call to r/s April appt. Per pt she did not know she had an April appt and as far as she knows she was discharged and does not need to see LL again. Per pt she is doing fine and she just had a physical with her internist and everything was fine. Per pt she does not wish to schedule at this time and if she feels she needs an appt w/LL she will call us. Message to desk nurse (Diane).

## 2012-04-15 NOTE — Telephone Encounter (Signed)
[name] received a call from pt who thought she was " Discharged" from Dr Darrold Span . ( missed recent appointment) . She reported to [facility] that she is doing good , saw her internist recently .  I called her husband and encouraged him to have pt call and schedule a f/u appointment with Dr Darrold Span for labs and exam . He said he will have her call Monday.

## 2012-04-18 ENCOUNTER — Telehealth: Payer: Self-pay | Admitting: Oncology

## 2012-04-18 NOTE — Telephone Encounter (Signed)
pt came by and scheduled appt for 05/15

## 2012-04-27 ENCOUNTER — Encounter: Payer: Self-pay | Admitting: Oncology

## 2012-04-27 ENCOUNTER — Ambulatory Visit (HOSPITAL_BASED_OUTPATIENT_CLINIC_OR_DEPARTMENT_OTHER): Payer: Medicare Other | Admitting: Oncology

## 2012-04-27 ENCOUNTER — Ambulatory Visit: Payer: Medicare Other | Admitting: Lab

## 2012-04-27 ENCOUNTER — Telehealth: Payer: Self-pay | Admitting: Oncology

## 2012-04-27 VITALS — BP 135/85 | HR 65 | Temp 97.3°F | Ht 61.0 in | Wt 120.7 lb

## 2012-04-27 DIAGNOSIS — C189 Malignant neoplasm of colon, unspecified: Secondary | ICD-10-CM

## 2012-04-27 DIAGNOSIS — C8379 Burkitt lymphoma, extranodal and solid organ sites: Secondary | ICD-10-CM | POA: Diagnosis not present

## 2012-04-27 LAB — COMPREHENSIVE METABOLIC PANEL
ALT: 20 U/L (ref 0–35)
AST: 21 U/L (ref 0–37)
Alkaline Phosphatase: 42 U/L (ref 39–117)
BUN: 23 mg/dL (ref 6–23)
Calcium: 9.2 mg/dL (ref 8.4–10.5)
Chloride: 107 mEq/L (ref 96–112)
Creatinine, Ser: 0.89 mg/dL (ref 0.50–1.10)
Potassium: 4.3 mEq/L (ref 3.5–5.3)

## 2012-04-27 LAB — CBC WITH DIFFERENTIAL/PLATELET
BASO%: 1 % (ref 0.0–2.0)
EOS%: 2.8 % (ref 0.0–7.0)
LYMPH%: 29.9 % (ref 14.0–49.7)
MCH: 32.9 pg (ref 25.1–34.0)
MCHC: 33.7 g/dL (ref 31.5–36.0)
MONO#: 0.3 10*3/uL (ref 0.1–0.9)
RBC: 4.31 10*6/uL (ref 3.70–5.45)
WBC: 4.7 10*3/uL (ref 3.9–10.3)
lymph#: 1.4 10*3/uL (ref 0.9–3.3)
nRBC: 0 % (ref 0–0)

## 2012-04-27 NOTE — Patient Instructions (Signed)
Call to set up mammograms at [facility], due any time after May 29  (last 05-11-12)

## 2012-04-27 NOTE — Progress Notes (Signed)
OFFICE PROGRESS NOTE Date of Visit 04-27-2012 Physicians R.Jacky Kindle, D.Juanda Chance [place])  INTERVAL HISTORY:  Patient is seen, alone for visit, in 6 month follow up of her history of T3N0 colon carcinoma diagnosed Aug 2008, and history of Burkitt's like lymphoma which had CR to treatment in 2004. She is now on prn follow up by Dr.Moore at [facility], who does not feel that she needs routine follow up CTs for the lymphoma now.  History is of NHL diagnosed 81 with abnormal circulating lymphocytes but asymptomatic over next number of years until this transformed into Burkitt's like lymphoma in Aug 2004. She was treated then with combination chemotherapy + Rituxan with CR. The colon cancer was treated with partial colectomy with anastomosis by Dr Johna Sheriff and adjuvant xeloda, with no known disease subsequently. I believe that she is due colonoscopy by Dr Juanda Chance next year. She should have hemoccult cards checked yearly between colonoscopies.  Patient tells me that she has been doing very well since she was here last. She sees Dr Jacky Kindle twice yearly, has had prolia injections x2 for low bone density, and has had no illnesses thru this winter. Energy is good, she has been skiing in [place], appetite good, no fever or night sweats, no pain, no bleeding, no GI symptoms, no bladder symptoms. Remainder of 10 point Review of Systems negative.  Objective:  Vital signs in last 24 hours:  BP 135/85  Pulse 65  Temp(Src) 97.3 F (36.3 C) (Oral)  Ht 5\' 1"  (1.549 m)  Wt 120 lb 11.2 oz (54.749 kg)  BMI 22.81 kg/m2 Weight is intentionally down 9 lbs in past 6 months. Easily mobile, looks entirely comfortable.  HEENT:mucous membranes moist, pharynx normal without lesions. Normal hair pattern. PERRL. LymphaticsCervical, supraclavicular, and axillary nodes normal.No inguinal adenopathy. Resp: clear to auscultation bilaterally and normal percussion bilaterally Cardio: regular rate and rhythm GI: soft, non-tender;  bowel sounds normal; no masses,  no organomegaly Extremities: extremities normal, atraumatic, no cyanosis or edema Neuro:no sensory deficits noted Breasts without dominant masses, skin or nipple findings and axillae benign. Skin with some tags and seborrheic keratoses beneath breasts which tend to be irritated with bras. Lab Results:   Basename 04/27/12 1120  WBC 4.7  HGB 14.2  HCT 42.1  PLT 160  ANC 2.8 Differential not remarkable  BMET CMET available after visit normal with exception of glucose 104 and T Prot 5.7. LDH normal at 126. CEA also available after visit <0.5. Studies/Results:  No results found.  Medications: I have reviewed the patient's current medications.  Assessment/Plan: 1.T3N0 colon cancer: she will be out 5 years from diagnosis this August. Colonoscopy per Dr Juanda Chance. Hemoccult cards during years that she does not have colonoscopy. 2.Burkitts-like lymphoma Aug 2004. Clinically doing well, with nothing of concern now.  I will see her once yearly, in ~ July to separate my visits out from Dr.Aronson, or certainly sooner if needed.  Salvatore Poe P, MD   04/27/2012, 9:20 PM

## 2012-04-27 NOTE — Telephone Encounter (Signed)
sch not open yet for July 2014,note made to call pt ans pt will also ck back with [place] in 12/2012    aom

## 2012-04-29 ENCOUNTER — Telehealth: Payer: Self-pay

## 2012-04-29 NOTE — Telephone Encounter (Signed)
Told Ms. Valvano that her cea and chemistries are all good from visit  04-27-12 per Dr. Darrold Span.   She will have hemoccult check with physical in Oct. With Dr. Jacky Kindle.  Dr. Darrold Span notified.

## 2012-05-02 ENCOUNTER — Telehealth: Payer: Self-pay

## 2012-05-02 NOTE — Telephone Encounter (Signed)
Faxed lab from 04-27-14 to Dr. Jacky Kindle.

## 2012-06-10 ENCOUNTER — Telehealth: Payer: Self-pay | Admitting: Oncology

## 2012-06-10 NOTE — Telephone Encounter (Signed)
Talked to pt's husband , gave him appt for 06/14/2013 lab and MD

## 2012-06-14 ENCOUNTER — Ambulatory Visit: Payer: Medicare Other | Admitting: Oncology

## 2012-06-14 ENCOUNTER — Other Ambulatory Visit: Payer: Medicare Other | Admitting: Lab

## 2012-06-29 DIAGNOSIS — M810 Age-related osteoporosis without current pathological fracture: Secondary | ICD-10-CM | POA: Diagnosis not present

## 2012-06-29 DIAGNOSIS — M899 Disorder of bone, unspecified: Secondary | ICD-10-CM | POA: Diagnosis not present

## 2012-06-29 DIAGNOSIS — M949 Disorder of cartilage, unspecified: Secondary | ICD-10-CM | POA: Diagnosis not present

## 2012-07-22 ENCOUNTER — Other Ambulatory Visit: Payer: Self-pay

## 2012-07-22 DIAGNOSIS — L821 Other seborrheic keratosis: Secondary | ICD-10-CM | POA: Diagnosis not present

## 2012-07-22 DIAGNOSIS — C44319 Basal cell carcinoma of skin of other parts of face: Secondary | ICD-10-CM | POA: Diagnosis not present

## 2012-07-22 DIAGNOSIS — L570 Actinic keratosis: Secondary | ICD-10-CM | POA: Diagnosis not present

## 2012-07-22 DIAGNOSIS — L719 Rosacea, unspecified: Secondary | ICD-10-CM | POA: Diagnosis not present

## 2012-07-22 DIAGNOSIS — L819 Disorder of pigmentation, unspecified: Secondary | ICD-10-CM | POA: Diagnosis not present

## 2012-07-22 DIAGNOSIS — C44111 Basal cell carcinoma of skin of unspecified eyelid, including canthus: Secondary | ICD-10-CM | POA: Diagnosis not present

## 2012-07-22 DIAGNOSIS — D485 Neoplasm of uncertain behavior of skin: Secondary | ICD-10-CM | POA: Diagnosis not present

## 2012-07-22 DIAGNOSIS — D235 Other benign neoplasm of skin of trunk: Secondary | ICD-10-CM | POA: Diagnosis not present

## 2012-09-14 DIAGNOSIS — Z23 Encounter for immunization: Secondary | ICD-10-CM | POA: Diagnosis not present

## 2012-09-27 DIAGNOSIS — Z1231 Encounter for screening mammogram for malignant neoplasm of breast: Secondary | ICD-10-CM | POA: Diagnosis not present

## 2012-10-10 DIAGNOSIS — E559 Vitamin D deficiency, unspecified: Secondary | ICD-10-CM | POA: Diagnosis not present

## 2012-10-10 DIAGNOSIS — E785 Hyperlipidemia, unspecified: Secondary | ICD-10-CM | POA: Diagnosis not present

## 2012-10-10 DIAGNOSIS — I10 Essential (primary) hypertension: Secondary | ICD-10-CM | POA: Diagnosis not present

## 2012-10-10 DIAGNOSIS — C189 Malignant neoplasm of colon, unspecified: Secondary | ICD-10-CM | POA: Diagnosis not present

## 2012-10-19 DIAGNOSIS — C44319 Basal cell carcinoma of skin of other parts of face: Secondary | ICD-10-CM | POA: Diagnosis not present

## 2012-10-21 DIAGNOSIS — E785 Hyperlipidemia, unspecified: Secondary | ICD-10-CM | POA: Diagnosis not present

## 2012-10-21 DIAGNOSIS — I10 Essential (primary) hypertension: Secondary | ICD-10-CM | POA: Diagnosis not present

## 2012-10-21 DIAGNOSIS — Z0000 Encounter for general adult medical examination without abnormal findings: Secondary | ICD-10-CM | POA: Diagnosis not present

## 2012-10-21 DIAGNOSIS — I259 Chronic ischemic heart disease, unspecified: Secondary | ICD-10-CM | POA: Diagnosis not present

## 2012-10-24 DIAGNOSIS — Z1212 Encounter for screening for malignant neoplasm of rectum: Secondary | ICD-10-CM | POA: Diagnosis not present

## 2013-01-03 DIAGNOSIS — M810 Age-related osteoporosis without current pathological fracture: Secondary | ICD-10-CM | POA: Diagnosis not present

## 2013-04-19 DIAGNOSIS — K219 Gastro-esophageal reflux disease without esophagitis: Secondary | ICD-10-CM | POA: Diagnosis not present

## 2013-04-19 DIAGNOSIS — I259 Chronic ischemic heart disease, unspecified: Secondary | ICD-10-CM | POA: Diagnosis not present

## 2013-04-19 DIAGNOSIS — Z1331 Encounter for screening for depression: Secondary | ICD-10-CM | POA: Diagnosis not present

## 2013-04-19 DIAGNOSIS — C8379 Burkitt lymphoma, extranodal and solid organ sites: Secondary | ICD-10-CM | POA: Diagnosis not present

## 2013-04-19 DIAGNOSIS — E559 Vitamin D deficiency, unspecified: Secondary | ICD-10-CM | POA: Diagnosis not present

## 2013-04-19 DIAGNOSIS — M1990 Unspecified osteoarthritis, unspecified site: Secondary | ICD-10-CM | POA: Diagnosis not present

## 2013-04-19 DIAGNOSIS — E785 Hyperlipidemia, unspecified: Secondary | ICD-10-CM | POA: Diagnosis not present

## 2013-04-19 DIAGNOSIS — IMO0002 Reserved for concepts with insufficient information to code with codable children: Secondary | ICD-10-CM | POA: Diagnosis not present

## 2013-04-19 DIAGNOSIS — I10 Essential (primary) hypertension: Secondary | ICD-10-CM | POA: Diagnosis not present

## 2013-06-12 ENCOUNTER — Encounter: Payer: Self-pay | Admitting: Oncology

## 2013-06-12 NOTE — Progress Notes (Signed)
Medical Oncology  Email notification from executive director of medical oncology that patient is cancelling her appointment 06-14-2013 "is doing just fine and will reschedule with another MD, if needed, later in year".  Ila Mcgill, MD

## 2013-06-14 ENCOUNTER — Ambulatory Visit: Payer: Medicare Other | Admitting: Oncology

## 2013-06-14 ENCOUNTER — Other Ambulatory Visit: Payer: Medicare Other | Admitting: Lab

## 2013-06-23 DIAGNOSIS — L821 Other seborrheic keratosis: Secondary | ICD-10-CM | POA: Diagnosis not present

## 2013-06-23 DIAGNOSIS — L719 Rosacea, unspecified: Secondary | ICD-10-CM | POA: Diagnosis not present

## 2013-06-23 DIAGNOSIS — D1801 Hemangioma of skin and subcutaneous tissue: Secondary | ICD-10-CM | POA: Diagnosis not present

## 2013-06-23 DIAGNOSIS — L7211 Pilar cyst: Secondary | ICD-10-CM | POA: Diagnosis not present

## 2013-06-23 DIAGNOSIS — D0460 Carcinoma in situ of skin of unspecified upper limb, including shoulder: Secondary | ICD-10-CM | POA: Diagnosis not present

## 2013-06-23 DIAGNOSIS — Z85828 Personal history of other malignant neoplasm of skin: Secondary | ICD-10-CM | POA: Diagnosis not present

## 2013-06-23 DIAGNOSIS — L570 Actinic keratosis: Secondary | ICD-10-CM | POA: Diagnosis not present

## 2013-06-30 ENCOUNTER — Encounter: Payer: Self-pay | Admitting: Cardiovascular Disease

## 2013-06-30 ENCOUNTER — Ambulatory Visit (INDEPENDENT_AMBULATORY_CARE_PROVIDER_SITE_OTHER): Payer: Medicare Other | Admitting: Cardiovascular Disease

## 2013-06-30 VITALS — BP 128/70 | HR 67 | Ht 61.0 in | Wt 125.0 lb

## 2013-06-30 DIAGNOSIS — I2510 Atherosclerotic heart disease of native coronary artery without angina pectoris: Secondary | ICD-10-CM

## 2013-06-30 DIAGNOSIS — R0602 Shortness of breath: Secondary | ICD-10-CM

## 2013-06-30 NOTE — Progress Notes (Signed)
HPI:  79 year old woman presenting for followup evaluation. She was last seen in our practice by Dr. Gala Romney and 2012. The patient has coronary artery disease. She initially presented in 2007 with a non-ST elevation MI. She was treated with a Taxus stent in the LAD. She's had no cardiac event since that time. She has remained remarkably active. She walks on a regular basis and even does some hiking. She has noticed that this year when she hikes she is much more short of breath with exertion and she has been in the past. She denies substernal chest pain or pressure. She denies edema, orthopnea, or PND. She does not have shortness of breath with routine daily activities.  Outpatient Encounter Prescriptions as of 06/30/2013  Medication Sig Dispense Refill  . aspirin 81 MG tablet Take 81 mg by mouth daily.      . Calcium Carbonate-Vitamin D 600-400 MG-UNIT per tablet Take 1 tablet by mouth 2 (two) times daily.       Marland Kitchen denosumab (PROLIA) 60 MG/ML SOLN Inject 60 mg into the skin every 6 (six) months.        . levothyroxine (SYNTHROID, LEVOTHROID) 25 MCG tablet Take 25 mg by mouth Daily.      Marland Kitchen loperamide (IMODIUM A-D) 2 MG tablet Take 2 mg by mouth as needed. As needed (traveling)       . LORazepam (ATIVAN) 1 MG tablet Take 1 mg by mouth at bedtime as needed.        . metoprolol succinate (TOPROL-XL) 25 MG 24 hr tablet Take 25 mg by mouth daily.      . multivitamin (THERAGRAN) per tablet Take 1 tablet by mouth daily.        . nitroGLYCERIN (NITROSTAT) 0.4 MG SL tablet Place 1 tablet (0.4 mg total) under the tongue every 5 (five) minutes as needed for chest pain.  25 tablet  12  . pantoprazole (PROTONIX) 40 MG tablet Take 40 mg by mouth Twice daily.      . rosuvastatin (CRESTOR) 20 MG tablet Take 20 mg by mouth daily.      Marland Kitchen MIRVASO 0.33 % GEL as directed.       No facility-administered encounter medications on file as of 06/30/2013.    Allergies  Allergen Reactions  . Penicillins     REACTION:  rash    Past Medical History  Diagnosis Date  . Status post chemotherapy   . Coronary artery disease     s/p NSTEMI in Jan 2007, with subsequent stenting of the prox LAD with a Taxus DES, stable  . Hypertension   . Hyperlipidemia   . Cancer     lymphomia, s/p chemotherapy  . Colon cancer     s/p resection, currently being treated with chemotherapy    ROS: Negative except as per HPI  BP 128/70  Pulse 67  Ht 5\' 1"  (1.549 m)  Wt 56.7 kg (125 lb)  BMI 23.63 kg/m2  PHYSICAL EXAM: Pt is alert and oriented, pleasant elderly woman appears younger than her stated age, in NAD HEENT: normal Neck: JVP - normal, carotids 2+= without bruits Lungs: CTA bilaterally CV: RRR without murmur or gallop Abd: soft, NT, Positive BS, no hepatomegaly Ext: no C/C/E, distal pulses intact and equal Skin: warm/dry no rash  EKG:  Normal sinus rhythm 67 beats per minute, septal infarct age undetermined, otherwise within normal limits.  ASSESSMENT AND PLAN: 1. Coronary artery disease. The patient has undergone previous PCI. I have reviewed her cardiac  cath report from 2007. She has new dyspnea with exertion which certainly could represent an ischemic equivalent. Her symptoms are CCS class II. She has a normal lung exam and no history of smoking. I think a stress Myoview scan is indicated to rule out myocardial ischemia. Further plans pending the results of her stress test. She remains on aspirin for antiplatelet therapy and Crestor for lipid-lowering. She is also on metoprolol.   2. Hypertension. Blood pressure is well controlled on metoprolol.  3. Hyperlipidemia. The patient's lipids are followed by her primary physician. She is on rosuvastatin 20 mg daily.  For follow-up I will see her back in 6 months unless her stress test is abnormal.  Tonny Bollman 06/30/2013 6:03 PM

## 2013-06-30 NOTE — Patient Instructions (Addendum)
Your physician wants you to follow-up in:  6 months.  You will receive a reminder letter in the mail two months in advance. If you don't receive a letter, please call our office to schedule the follow-up appointment.  Your physician has requested that you have an exercise stress myoview. For further information please visit [email or web address]. Please follow instruction sheet, as given.  

## 2013-07-04 DIAGNOSIS — M810 Age-related osteoporosis without current pathological fracture: Secondary | ICD-10-CM | POA: Diagnosis not present

## 2013-07-10 ENCOUNTER — Ambulatory Visit (HOSPITAL_COMMUNITY): Payer: Medicare Other | Attending: Cardiovascular Disease | Admitting: Radiology

## 2013-07-10 VITALS — BP 165/92 | Ht 61.0 in | Wt 125.0 lb

## 2013-07-10 DIAGNOSIS — I2510 Atherosclerotic heart disease of native coronary artery without angina pectoris: Secondary | ICD-10-CM

## 2013-07-10 DIAGNOSIS — R0609 Other forms of dyspnea: Secondary | ICD-10-CM | POA: Diagnosis not present

## 2013-07-10 DIAGNOSIS — R0989 Other specified symptoms and signs involving the circulatory and respiratory systems: Secondary | ICD-10-CM | POA: Diagnosis not present

## 2013-07-10 DIAGNOSIS — R0602 Shortness of breath: Secondary | ICD-10-CM | POA: Diagnosis not present

## 2013-07-10 DIAGNOSIS — I252 Old myocardial infarction: Secondary | ICD-10-CM | POA: Insufficient documentation

## 2013-07-10 DIAGNOSIS — Z9861 Coronary angioplasty status: Secondary | ICD-10-CM | POA: Insufficient documentation

## 2013-07-10 DIAGNOSIS — I10 Essential (primary) hypertension: Secondary | ICD-10-CM | POA: Insufficient documentation

## 2013-07-10 DIAGNOSIS — E785 Hyperlipidemia, unspecified: Secondary | ICD-10-CM | POA: Diagnosis not present

## 2013-07-10 DIAGNOSIS — Z87891 Personal history of nicotine dependence: Secondary | ICD-10-CM | POA: Diagnosis not present

## 2013-07-10 MED ORDER — TECHNETIUM TC 99M SESTAMIBI GENERIC - CARDIOLITE
11.0000 | Freq: Once | INTRAVENOUS | Status: AC | PRN
  Administered 2013-07-10: 11 via INTRAVENOUS

## 2013-07-10 MED ORDER — TECHNETIUM TC 99M SESTAMIBI GENERIC - CARDIOLITE
33.0000 | Freq: Once | INTRAVENOUS | Status: AC | PRN
  Administered 2013-07-10: 33 via INTRAVENOUS

## 2013-07-10 NOTE — Progress Notes (Signed)
[facility] SITE 3 NUCLEAR MED [address] [place], [place] [address] [phone]    Cardiology Nuclear Med Study  [name] is a 79 y.o. female     MRN : [id number]     DOB: 10/23/1929  Procedure Date: 07/10/2013  Nuclear Med Background Indication for Stress Test:  Evaluation for Ischemia, Stent Patency and PTCA Patency History:  02/22/07 ECHO: EF: 60%  4/08 MI-NSTEMI-Heart Cath-EF:50% N/O CAD Stent/Angioplasty-LAD H/O Chemo Burketts Lymphoma, and Colon Ca, Cardiac Risk Factors: History of Smoking, Hypertension and Lipids  Symptoms:  DOE and SOB   Nuclear Pre-Procedure Caffeine/Decaff Intake:  None NPO After: 12:00am   Lungs:  clear O2 Sat: 96% on room air. IV 0.9% NS with Angio Cath:  22g  IV Site: R Wrist  IV Started by:  Cathlyn Parsons, RN  Chest Size (in):  34 Cup Size: D  Height: 5\' 1"  (1.549 m)  Weight:  125 lb (56.7 kg)  BMI:  Body mass index is 23.63 kg/(m^2). Tech Comments:  No Toprol x 36 hrs. The patient's pictures were checked for preliminary changes and none were observed.     Nuclear Med Study 1 or 2 day study: 1 day  Stress Test Type:  Stress  Reading MD: Dietrich Pates, MD  Order Authorizing Provider:  Casimiro Needle Cooper,MD  Resting Radionuclide: Technetium 26m Sestamibi  Resting Radionuclide Dose: 11.0 mCi   Stress Radionuclide:  Technetium 40m Sestamibi  Stress Radionuclide Dose: 33.0 mCi           Stress Protocol Rest HR: 64 Stress HR: 125  Rest BP: 165/92 Stress BP: 175/81  Exercise Time (min): 6:31 METS: 7.70   Predicted Max HR: 138 bpm % Max HR: 90.58 bpm Rate Pressure Product: 01027   Dose of Adenosine (mg):  n/a Dose of Lexiscan: n/a mg  Dose of Atropine (mg): n/a Dose of Dobutamine: n/a mcg/kg/min (at max HR)  Stress Test Technologist: Milana Na, EMT-P  Nuclear Technologist:  Doyne Keel, CNMT     Rest Procedure:  Myocardial perfusion imaging was performed at rest 45 minutes following the intravenous administration of  Technetium 31m Sestamibi. Rest ECG: NSR - Normal EKG  Stress Procedure:  The patient exercised on the treadmill utilizing the Bruce Protocol for 6:31 minutes. The patient stopped due to fatigue,sob, and denied any chest pain.  Technetium 68m Sestamibi was injected at peak exercise and myocardial perfusion imaging was performed after a brief delay. Stress ECG: Significant ST abnormalities consistent with ischemia.  QPS Raw Data Images: Soft tissue (diaphragm) underlies heart. Stress Images:  Normal homogeneous uptake in all areas of the myocardium. Rest Images:  Mild thinning with decreased counts in the inferior wall. Subtraction (SDS):  No evidence of ischemia. Transient Ischemic Dilatation (Normal <1.22):  n/a Lung/Heart Ratio (Normal <0.45):  0.35  Quantitative Gated Spect Images QGS EDV:  44 ml QGS ESV:  9 ml  Impression Exercise Capacity:  Fair exercise capacity. BP Response:  Normal blood pressure response. Clinical Symptoms:  No chest pain. ECG Impression: 1 mm ST depression in the inferior leads consistent with ischemia in Stage II.Marland Kitchen  Improved during recovery period Comparison with Prior Nuclear Study: No change from previous report for perfusion.  Overall Impression:  Normal stress nuclear study.  LV Ejection Fraction: 79%.  LV Wall Motion:  NL LV Function; NL Wall Motion  Dietrich Pates

## 2013-08-01 ENCOUNTER — Encounter: Payer: Self-pay | Admitting: Internal Medicine

## 2013-09-04 ENCOUNTER — Encounter: Payer: Self-pay | Admitting: Internal Medicine

## 2013-09-05 ENCOUNTER — Encounter: Payer: Self-pay | Admitting: *Deleted

## 2013-09-15 DIAGNOSIS — Z23 Encounter for immunization: Secondary | ICD-10-CM | POA: Diagnosis not present

## 2013-09-22 DIAGNOSIS — H903 Sensorineural hearing loss, bilateral: Secondary | ICD-10-CM | POA: Diagnosis not present

## 2013-10-16 DIAGNOSIS — E559 Vitamin D deficiency, unspecified: Secondary | ICD-10-CM | POA: Diagnosis not present

## 2013-10-16 DIAGNOSIS — I10 Essential (primary) hypertension: Secondary | ICD-10-CM | POA: Diagnosis not present

## 2013-10-16 DIAGNOSIS — E785 Hyperlipidemia, unspecified: Secondary | ICD-10-CM | POA: Diagnosis not present

## 2013-10-23 DIAGNOSIS — C8379 Burkitt lymphoma, extranodal and solid organ sites: Secondary | ICD-10-CM | POA: Diagnosis not present

## 2013-10-23 DIAGNOSIS — M1990 Unspecified osteoarthritis, unspecified site: Secondary | ICD-10-CM | POA: Diagnosis not present

## 2013-10-23 DIAGNOSIS — I10 Essential (primary) hypertension: Secondary | ICD-10-CM | POA: Diagnosis not present

## 2013-10-23 DIAGNOSIS — C189 Malignant neoplasm of colon, unspecified: Secondary | ICD-10-CM | POA: Diagnosis not present

## 2013-10-23 DIAGNOSIS — Z0000 Encounter for general adult medical examination without abnormal findings: Secondary | ICD-10-CM | POA: Diagnosis not present

## 2013-10-23 DIAGNOSIS — E785 Hyperlipidemia, unspecified: Secondary | ICD-10-CM | POA: Diagnosis not present

## 2013-10-23 DIAGNOSIS — K219 Gastro-esophageal reflux disease without esophagitis: Secondary | ICD-10-CM | POA: Diagnosis not present

## 2013-10-23 DIAGNOSIS — IMO0002 Reserved for concepts with insufficient information to code with codable children: Secondary | ICD-10-CM | POA: Diagnosis not present

## 2013-10-24 DIAGNOSIS — E559 Vitamin D deficiency, unspecified: Secondary | ICD-10-CM | POA: Diagnosis not present

## 2013-10-24 DIAGNOSIS — M899 Disorder of bone, unspecified: Secondary | ICD-10-CM | POA: Diagnosis not present

## 2013-10-30 DIAGNOSIS — Z1212 Encounter for screening for malignant neoplasm of rectum: Secondary | ICD-10-CM | POA: Diagnosis not present

## 2013-11-01 ENCOUNTER — Encounter: Payer: Medicare Other | Admitting: Internal Medicine

## 2013-11-01 ENCOUNTER — Encounter: Payer: Self-pay | Admitting: Internal Medicine

## 2013-11-01 NOTE — Progress Notes (Signed)
[name] Sep 24, 1929 MRN [id number]   Pt left before being seen. Please see phone note dated 11/02/13.  11/01/2013 [name]  This encounter was created in error - please disregard.

## 2013-11-02 ENCOUNTER — Telehealth: Payer: Self-pay | Admitting: Internal Medicine

## 2013-11-02 NOTE — Telephone Encounter (Signed)
Patient was scheduled for an appointment with me on 11/02/13. She unfortunately could not wait any longer for her appointment as I was running behind. I contacted her at the end of the day. After speaking with her, we decided against colonoscopy due to her age. She is also not very interested in having a colonoscopy at this time. I am in agreement with this plan.

## 2013-12-29 DIAGNOSIS — H2640 Unspecified secondary cataract: Secondary | ICD-10-CM | POA: Diagnosis not present

## 2013-12-29 DIAGNOSIS — H259 Unspecified age-related cataract: Secondary | ICD-10-CM | POA: Diagnosis not present

## 2013-12-29 DIAGNOSIS — H43819 Vitreous degeneration, unspecified eye: Secondary | ICD-10-CM | POA: Diagnosis not present

## 2013-12-29 DIAGNOSIS — H52209 Unspecified astigmatism, unspecified eye: Secondary | ICD-10-CM | POA: Diagnosis not present

## 2014-01-04 DIAGNOSIS — M810 Age-related osteoporosis without current pathological fracture: Secondary | ICD-10-CM | POA: Diagnosis not present

## 2014-01-04 DIAGNOSIS — IMO0002 Reserved for concepts with insufficient information to code with codable children: Secondary | ICD-10-CM | POA: Diagnosis not present

## 2014-01-16 DIAGNOSIS — H25019 Cortical age-related cataract, unspecified eye: Secondary | ICD-10-CM | POA: Diagnosis not present

## 2014-01-16 DIAGNOSIS — H269 Unspecified cataract: Secondary | ICD-10-CM | POA: Diagnosis not present

## 2014-01-16 DIAGNOSIS — H2510 Age-related nuclear cataract, unspecified eye: Secondary | ICD-10-CM | POA: Diagnosis not present

## 2014-01-16 DIAGNOSIS — H52209 Unspecified astigmatism, unspecified eye: Secondary | ICD-10-CM | POA: Diagnosis not present

## 2014-01-16 DIAGNOSIS — H25039 Anterior subcapsular polar age-related cataract, unspecified eye: Secondary | ICD-10-CM | POA: Diagnosis not present

## 2014-02-19 DIAGNOSIS — H35379 Puckering of macula, unspecified eye: Secondary | ICD-10-CM | POA: Diagnosis not present

## 2014-03-02 ENCOUNTER — Encounter: Payer: Self-pay | Admitting: Internal Medicine

## 2014-05-11 DIAGNOSIS — M1990 Unspecified osteoarthritis, unspecified site: Secondary | ICD-10-CM | POA: Diagnosis not present

## 2014-05-11 DIAGNOSIS — Z1331 Encounter for screening for depression: Secondary | ICD-10-CM | POA: Diagnosis not present

## 2014-05-11 DIAGNOSIS — C189 Malignant neoplasm of colon, unspecified: Secondary | ICD-10-CM | POA: Diagnosis not present

## 2014-05-11 DIAGNOSIS — I259 Chronic ischemic heart disease, unspecified: Secondary | ICD-10-CM | POA: Diagnosis not present

## 2014-05-11 DIAGNOSIS — K219 Gastro-esophageal reflux disease without esophagitis: Secondary | ICD-10-CM | POA: Diagnosis not present

## 2014-05-11 DIAGNOSIS — C8379 Burkitt lymphoma, extranodal and solid organ sites: Secondary | ICD-10-CM | POA: Diagnosis not present

## 2014-05-11 DIAGNOSIS — I10 Essential (primary) hypertension: Secondary | ICD-10-CM | POA: Diagnosis not present

## 2014-05-11 DIAGNOSIS — E785 Hyperlipidemia, unspecified: Secondary | ICD-10-CM | POA: Diagnosis not present

## 2014-06-22 DIAGNOSIS — L821 Other seborrheic keratosis: Secondary | ICD-10-CM | POA: Diagnosis not present

## 2014-06-22 DIAGNOSIS — L819 Disorder of pigmentation, unspecified: Secondary | ICD-10-CM | POA: Diagnosis not present

## 2014-06-22 DIAGNOSIS — L570 Actinic keratosis: Secondary | ICD-10-CM | POA: Diagnosis not present

## 2014-06-22 DIAGNOSIS — L820 Inflamed seborrheic keratosis: Secondary | ICD-10-CM | POA: Diagnosis not present

## 2014-06-22 DIAGNOSIS — Z85828 Personal history of other malignant neoplasm of skin: Secondary | ICD-10-CM | POA: Diagnosis not present

## 2014-06-22 DIAGNOSIS — D1801 Hemangioma of skin and subcutaneous tissue: Secondary | ICD-10-CM | POA: Diagnosis not present

## 2014-06-22 DIAGNOSIS — L7211 Pilar cyst: Secondary | ICD-10-CM | POA: Diagnosis not present

## 2014-06-29 DIAGNOSIS — M79609 Pain in unspecified limb: Secondary | ICD-10-CM | POA: Diagnosis not present

## 2014-07-05 DIAGNOSIS — M810 Age-related osteoporosis without current pathological fracture: Secondary | ICD-10-CM | POA: Diagnosis not present

## 2014-07-05 DIAGNOSIS — IMO0002 Reserved for concepts with insufficient information to code with codable children: Secondary | ICD-10-CM | POA: Diagnosis not present

## 2014-07-10 DIAGNOSIS — G5760 Lesion of plantar nerve, unspecified lower limb: Secondary | ICD-10-CM | POA: Diagnosis not present

## 2014-07-10 DIAGNOSIS — M25579 Pain in unspecified ankle and joints of unspecified foot: Secondary | ICD-10-CM | POA: Diagnosis not present

## 2014-08-03 DIAGNOSIS — M25579 Pain in unspecified ankle and joints of unspecified foot: Secondary | ICD-10-CM | POA: Diagnosis not present

## 2014-08-03 DIAGNOSIS — G5760 Lesion of plantar nerve, unspecified lower limb: Secondary | ICD-10-CM | POA: Diagnosis not present

## 2014-08-13 DIAGNOSIS — M84439D Pathological fracture, unspecified ulna and radius, subsequent encounter for fracture with routine healing: Secondary | ICD-10-CM | POA: Diagnosis not present

## 2014-09-10 DIAGNOSIS — M84469D Pathological fracture, unspecified tibia and fibula, subsequent encounter for fracture with routine healing: Secondary | ICD-10-CM | POA: Diagnosis not present

## 2014-10-04 DIAGNOSIS — M1990 Unspecified osteoarthritis, unspecified site: Secondary | ICD-10-CM | POA: Diagnosis not present

## 2014-10-04 DIAGNOSIS — E785 Hyperlipidemia, unspecified: Secondary | ICD-10-CM | POA: Diagnosis not present

## 2014-10-04 DIAGNOSIS — Z7982 Long term (current) use of aspirin: Secondary | ICD-10-CM | POA: Diagnosis not present

## 2014-10-04 DIAGNOSIS — S51802A Unspecified open wound of left forearm, initial encounter: Secondary | ICD-10-CM | POA: Diagnosis not present

## 2014-10-04 DIAGNOSIS — T148 Other injury of unspecified body region: Secondary | ICD-10-CM | POA: Diagnosis not present

## 2014-10-04 DIAGNOSIS — I10 Essential (primary) hypertension: Secondary | ICD-10-CM | POA: Diagnosis not present

## 2014-10-04 DIAGNOSIS — Z9181 History of falling: Secondary | ICD-10-CM | POA: Diagnosis not present

## 2014-10-04 DIAGNOSIS — S61412A Laceration without foreign body of left hand, initial encounter: Secondary | ICD-10-CM | POA: Diagnosis not present

## 2014-10-04 DIAGNOSIS — Z743 Need for continuous supervision: Secondary | ICD-10-CM | POA: Diagnosis not present

## 2014-10-04 DIAGNOSIS — E569 Vitamin deficiency, unspecified: Secondary | ICD-10-CM | POA: Diagnosis not present

## 2014-10-16 DIAGNOSIS — E559 Vitamin D deficiency, unspecified: Secondary | ICD-10-CM | POA: Diagnosis not present

## 2014-10-16 DIAGNOSIS — R05 Cough: Secondary | ICD-10-CM | POA: Diagnosis not present

## 2014-10-16 DIAGNOSIS — E785 Hyperlipidemia, unspecified: Secondary | ICD-10-CM | POA: Diagnosis not present

## 2014-10-16 DIAGNOSIS — Z6823 Body mass index (BMI) 23.0-23.9, adult: Secondary | ICD-10-CM | POA: Diagnosis not present

## 2014-10-16 DIAGNOSIS — I10 Essential (primary) hypertension: Secondary | ICD-10-CM | POA: Diagnosis not present

## 2014-10-16 DIAGNOSIS — Z4802 Encounter for removal of sutures: Secondary | ICD-10-CM | POA: Diagnosis not present

## 2014-10-25 DIAGNOSIS — K219 Gastro-esophageal reflux disease without esophagitis: Secondary | ICD-10-CM | POA: Diagnosis not present

## 2014-10-25 DIAGNOSIS — E785 Hyperlipidemia, unspecified: Secondary | ICD-10-CM | POA: Diagnosis not present

## 2014-10-25 DIAGNOSIS — I10 Essential (primary) hypertension: Secondary | ICD-10-CM | POA: Diagnosis not present

## 2014-10-25 DIAGNOSIS — Z9861 Coronary angioplasty status: Secondary | ICD-10-CM | POA: Diagnosis not present

## 2014-10-25 DIAGNOSIS — Z23 Encounter for immunization: Secondary | ICD-10-CM | POA: Diagnosis not present

## 2014-10-25 DIAGNOSIS — Z0000 Encounter for general adult medical examination without abnormal findings: Secondary | ICD-10-CM | POA: Diagnosis not present

## 2014-10-25 DIAGNOSIS — Z6823 Body mass index (BMI) 23.0-23.9, adult: Secondary | ICD-10-CM | POA: Diagnosis not present

## 2014-10-26 DIAGNOSIS — Z1212 Encounter for screening for malignant neoplasm of rectum: Secondary | ICD-10-CM | POA: Diagnosis not present

## 2015-01-02 DIAGNOSIS — Z6824 Body mass index (BMI) 24.0-24.9, adult: Secondary | ICD-10-CM | POA: Diagnosis not present

## 2015-01-02 DIAGNOSIS — M810 Age-related osteoporosis without current pathological fracture: Secondary | ICD-10-CM | POA: Diagnosis not present

## 2015-03-06 ENCOUNTER — Other Ambulatory Visit: Payer: Self-pay | Admitting: Internal Medicine

## 2015-03-06 DIAGNOSIS — Z6823 Body mass index (BMI) 23.0-23.9, adult: Secondary | ICD-10-CM | POA: Diagnosis not present

## 2015-03-06 DIAGNOSIS — R6884 Jaw pain: Secondary | ICD-10-CM

## 2015-03-07 ENCOUNTER — Ambulatory Visit
Admission: RE | Admit: 2015-03-07 | Discharge: 2015-03-07 | Disposition: A | Discharge status: Home / Self Care | Payer: Medicare Other | Source: Ambulatory Visit | Attending: Internal Medicine | Admitting: Internal Medicine

## 2015-03-07 DIAGNOSIS — R6884 Jaw pain: Secondary | ICD-10-CM

## 2015-04-30 DIAGNOSIS — M272 Inflammatory conditions of jaws: Secondary | ICD-10-CM | POA: Diagnosis not present

## 2015-05-07 DIAGNOSIS — M272 Inflammatory conditions of jaws: Secondary | ICD-10-CM | POA: Diagnosis not present

## 2015-05-16 DIAGNOSIS — M272 Inflammatory conditions of jaws: Secondary | ICD-10-CM | POA: Diagnosis not present

## 2015-05-16 DIAGNOSIS — M898X8 Other specified disorders of bone, other site: Secondary | ICD-10-CM | POA: Diagnosis not present

## 2015-05-22 DIAGNOSIS — M869 Osteomyelitis, unspecified: Secondary | ICD-10-CM | POA: Diagnosis not present

## 2015-06-13 DIAGNOSIS — M272 Inflammatory conditions of jaws: Secondary | ICD-10-CM | POA: Diagnosis not present

## 2015-06-14 ENCOUNTER — Encounter (HOSPITAL_COMMUNITY)
Admission: RE | Admit: 2015-06-14 | Discharge: 2015-06-14 | Disposition: A | Discharge status: Home / Self Care | Payer: Medicare Other | Source: Ambulatory Visit | Attending: Infectious Diseases | Admitting: Infectious Diseases

## 2015-06-14 ENCOUNTER — Other Ambulatory Visit: Payer: Self-pay | Admitting: Infectious Diseases

## 2015-06-14 ENCOUNTER — Ambulatory Visit (HOSPITAL_COMMUNITY)
Admission: RE | Admit: 2015-06-14 | Discharge: 2015-06-14 | Disposition: A | Discharge status: Home / Self Care | Payer: Medicare Other | Source: Ambulatory Visit | Attending: Infectious Diseases | Admitting: Infectious Diseases

## 2015-06-14 ENCOUNTER — Ambulatory Visit (INDEPENDENT_AMBULATORY_CARE_PROVIDER_SITE_OTHER): Payer: Medicare Other | Admitting: Infectious Diseases

## 2015-06-14 ENCOUNTER — Encounter: Payer: Self-pay | Admitting: Infectious Diseases

## 2015-06-14 VITALS — BP 111/76 | HR 99 | Temp 98.2°F | Wt 120.5 lb

## 2015-06-14 DIAGNOSIS — M272 Inflammatory conditions of jaws: Secondary | ICD-10-CM

## 2015-06-14 MED ORDER — ERTAPENEM SODIUM 1 G IJ SOLR: 1.0000 g | INTRAMUSCULAR | Status: DC

## 2015-06-14 MED ORDER — VANCOMYCIN HCL IN DEXTROSE 1-5 GM/200ML-% IV SOLN
1000.0000 mg | Freq: Once | INTRAVENOUS | Status: AC
  Administered 2015-06-14: 1000 mg via INTRAVENOUS

## 2015-06-14 MED ORDER — HEPARIN SOD (PORK) LOCK FLUSH 100 UNIT/ML IV SOLN
250.0000 [IU] | INTRAVENOUS | Status: AC | PRN
  Administered 2015-06-14: 250 [IU]

## 2015-06-14 MED ORDER — LIDOCAINE HCL 1 % IJ SOLN
INTRAMUSCULAR | Status: AC
  Filled 2015-06-14: qty 20

## 2015-06-14 MED ORDER — SODIUM CHLORIDE 0.9 % IV SOLN
1.0000 g | Freq: Once | INTRAVENOUS | Status: AC
  Administered 2015-06-14: 1 g via INTRAVENOUS
  Filled 2015-06-14: qty 1

## 2015-06-14 MED ORDER — VANCOMYCIN HCL IN DEXTROSE 1-5 GM/200ML-% IV SOLN
INTRAVENOUS | Status: AC
  Administered 2015-06-14: 1000 mg via INTRAVENOUS
  Filled 2015-06-14: qty 200

## 2015-06-14 MED ORDER — ERTAPENEM SODIUM 1 G IJ SOLR: 1.0000 g | Freq: Once | INTRAMUSCULAR | Status: DC

## 2015-06-14 MED ORDER — HEPARIN SOD (PORK) LOCK FLUSH 100 UNIT/ML IV SOLN
INTRAVENOUS | Status: AC
  Administered 2015-06-14: 250 [IU]
  Filled 2015-06-14: qty 5

## 2015-06-14 MED ORDER — VANCOMYCIN HCL 10 G IV SOLR: 1000.0000 mg | Freq: Once | INTRAVENOUS | Status: DC

## 2015-06-14 NOTE — Procedures (Signed)
Successful RUE SL POWER PICC TIP SVC/RA NO COMP STABLE READY FOR USE  

## 2015-06-14 NOTE — Assessment & Plan Note (Signed)
Her jaw exam is remarkable for swelling, pain and d/c. Will get her a PIC line and get her started on vanco/invanz (hopefully today). This will not cover nocardia but will cover actino. Her PEN allergy is noted and I believe it is low significance.  Her aspirate done yesterday shows GPC in pairs and chains on g/s. Cx is pending.  Will see her back in 2 weeks.

## 2015-06-14 NOTE — Progress Notes (Signed)
   Subjective:    Patient ID: [name], female    DOB: 06-09-1929, 81 y.o.   MRN: [id number]  HPI 34 F with hx of burkittt's lymphoma (1992, 2004), CAD with stent (2008), has been on xomeda and then prolia. She had a molar removed in October 2015. By February, developed (1 week after prolia shot) hypersensitivity, pain. She had no breakdown of her skin or bone. She has had pain that has become progressive and across her mandible.  By 02-2015 she had CT showing localized osteomyelitis.  Since February she has been on clindamycin, 4 courses. Has had bx at her oral surgeon, this was repeated today.   Review of Systems  Constitutional: Negative for fever, chills, appetite change and unexpected weight change.  HENT: Positive for dental problem.   Gastrointestinal: Negative for diarrhea and constipation.  Genitourinary: Negative for difficulty urinating.  Hematological: Negative for adenopathy.       Objective:   Physical Exam  Constitutional: She appears well-developed and well-nourished.  HENT:  Head:    Mouth/Throat: No oropharyngeal exudate.  Eyes: EOM are normal. Pupils are equal, round, and reactive to light.  Neck: Neck supple.  Cardiovascular: Normal rate, regular rhythm and normal heart sounds.   Pulmonary/Chest: Effort normal and breath sounds normal.  Abdominal: Soft. Bowel sounds are normal. There is no tenderness. There is no rebound.  Musculoskeletal: She exhibits no edema.  Lymphadenopathy:    She has no cervical adenopathy.       Assessment & Plan:

## 2015-06-17 DIAGNOSIS — M272 Inflammatory conditions of jaws: Secondary | ICD-10-CM | POA: Diagnosis not present

## 2015-06-17 LAB — WOUND CULTURE
GRAM STAIN: NONE SEEN
Gram Stain: NONE SEEN
Gram Stain: NONE SEEN

## 2015-06-22 DIAGNOSIS — M272 Inflammatory conditions of jaws: Secondary | ICD-10-CM | POA: Diagnosis not present

## 2015-06-24 LAB — ANAEROBIC CULTURE
GRAM STAIN: NONE SEEN
Gram Stain: NONE SEEN

## 2015-06-25 DIAGNOSIS — M272 Inflammatory conditions of jaws: Secondary | ICD-10-CM | POA: Diagnosis not present

## 2015-06-26 ENCOUNTER — Encounter: Payer: Self-pay | Admitting: Infectious Diseases

## 2015-06-26 ENCOUNTER — Ambulatory Visit (INDEPENDENT_AMBULATORY_CARE_PROVIDER_SITE_OTHER): Payer: Medicare Other | Admitting: Infectious Diseases

## 2015-06-26 VITALS — BP 138/91 | HR 69 | Temp 98.3°F | Wt 119.0 lb

## 2015-06-26 DIAGNOSIS — M272 Inflammatory conditions of jaws: Secondary | ICD-10-CM | POA: Diagnosis not present

## 2015-06-26 NOTE — Progress Notes (Signed)
   Subjective:    Patient ID: [name], female    DOB: 11-02-1929, 81 y.o.   MRN: [id number]  HPI 72 F with hx of burkittt's lymphoma (1992, 2004), CAD with stent (2008), has been on xomeda and then prolia. She had a molar removed in October 2015. By February, developed (1 week after prolia shot) hypersensitivity, pain. She had no breakdown of her skin or bone. She has had pain that has become progressive and across her mandible.  By 02-2015 she had CT showing localized osteomyelitis.  Since February she has been on clindamycin, 4 courses. Has had bx at her oral surgeon, Cx grew micro-aerophilic strep.  She was seen in ID on 7-1 and was started on vanco/invanz. Cr 7-12 normal.  Her submandibular os is closed, no further d/c. She states her jaw feels lumpy.  Has had difficulty with getting vanco dose correct. Random level at 11am yesterday 18.5 after 1.25g. No problems with PIC- no redness, d/c. Flow nl.  No diarrhea since loose bm.  Fatigued post anbx.   Anbx day 12/42.  Review of Systems See HPI.     Objective:   Physical Exam  Constitutional: She appears well-developed and well-nourished.  HENT:  Mouth/Throat: No oropharyngeal exudate.  Socket on L mandible is healed.  No tenderness of jaw.  Some bony archetecture changes.  No LAN  Musculoskeletal:       Arms:      Assessment & Plan:

## 2015-06-26 NOTE — Assessment & Plan Note (Signed)
Will continue her anbx for 29-30 more days She has made some improvement.  Explained to her that this may never feel totally normal.  Will plan to repeat imaging at end of therapy.

## 2015-06-27 DIAGNOSIS — L821 Other seborrheic keratosis: Secondary | ICD-10-CM | POA: Diagnosis not present

## 2015-06-27 DIAGNOSIS — L918 Other hypertrophic disorders of the skin: Secondary | ICD-10-CM | POA: Diagnosis not present

## 2015-06-27 DIAGNOSIS — L7211 Pilar cyst: Secondary | ICD-10-CM | POA: Diagnosis not present

## 2015-06-27 DIAGNOSIS — Z85828 Personal history of other malignant neoplasm of skin: Secondary | ICD-10-CM | POA: Diagnosis not present

## 2015-07-02 DIAGNOSIS — M272 Inflammatory conditions of jaws: Secondary | ICD-10-CM | POA: Diagnosis not present

## 2015-07-09 ENCOUNTER — Telehealth: Payer: Self-pay | Admitting: Licensed Clinical Social Worker

## 2015-07-09 DIAGNOSIS — M272 Inflammatory conditions of jaws: Secondary | ICD-10-CM | POA: Diagnosis not present

## 2015-07-09 NOTE — Telephone Encounter (Signed)
PIC can be pulled.  Please discuss with me tomorrow regarding f/u imaging.

## 2015-07-09 NOTE — Telephone Encounter (Signed)
Patient's IV antibiotics end on 07/25/2015 her appointment with Dr. Johnnye Sima is not until 8/31 can the PICC line and antibiotics be discontinued? Per last ov Dr. Johnnye Sima wants imaging at the end of therapy, does that still need to be scheduled? Please advise

## 2015-07-10 NOTE — Telephone Encounter (Signed)
Thank you, I notified Kiana

## 2015-07-14 DIAGNOSIS — M272 Inflammatory conditions of jaws: Secondary | ICD-10-CM | POA: Diagnosis not present

## 2015-07-18 DIAGNOSIS — M272 Inflammatory conditions of jaws: Secondary | ICD-10-CM | POA: Diagnosis not present

## 2015-08-01 LAB — AFB CULTURE WITH SMEAR (NOT AT ARMC): ACID FAST SMEAR: NONE SEEN

## 2015-08-12 ENCOUNTER — Telehealth: Payer: Self-pay | Admitting: *Deleted

## 2015-08-12 NOTE — Telephone Encounter (Signed)
Son called and asked if he would need to come with the patient to next appt. Advised him his decision as she will probably get labs and the doctor may or may not put her back on oral medication. Asked if he did not come if we could fax him the office note and advised him he would need to call but that since he does have permission do not see where that would be a problem.

## 2015-08-14 ENCOUNTER — Ambulatory Visit (INDEPENDENT_AMBULATORY_CARE_PROVIDER_SITE_OTHER): Payer: Medicare Other | Admitting: Infectious Diseases

## 2015-08-14 ENCOUNTER — Encounter: Payer: Self-pay | Admitting: Infectious Diseases

## 2015-08-14 VITALS — BP 145/81 | HR 86 | Temp 97.8°F | Ht 61.5 in | Wt 119.0 lb

## 2015-08-14 DIAGNOSIS — Z23 Encounter for immunization: Secondary | ICD-10-CM | POA: Diagnosis not present

## 2015-08-14 DIAGNOSIS — M272 Inflammatory conditions of jaws: Secondary | ICD-10-CM | POA: Diagnosis not present

## 2015-08-14 LAB — C-REACTIVE PROTEIN

## 2015-08-14 NOTE — Progress Notes (Signed)
   Subjective:    Patient ID: [name], female    DOB: May 30, 1929, 81 y.o.   MRN: [id number]  HPI    Review of Systems     Objective:   Physical Exam  HENT:  The L mandibular wound is well healed. There is no tenderness, no fluctuance. There is no gross bony deformity.   Musculoskeletal:  RUE no cordis or wound from prev pic. There is a small bruise at the prev site.           Assessment & Plan:

## 2015-08-14 NOTE — Progress Notes (Signed)
   Subjective:    Patient ID: [name], female    DOB: 07/24/1929, 81 y.o.   MRN: [id number]  HPI 34 F with hx of burkittt's lymphoma (1992, 2004), CAD with stent (2008), has been on xomeda and then prolia. She had a molar removed in October 2015. By February, developed (1 week after prolia shot) hypersensitivity, pain. She had no breakdown of her skin or bone. She has had pain that has become progressive and across her mandible.  By 02-2015 she had CT showing localized osteomyelitis.  Since February she has been on clindamycin, 4 courses. Has had bx at her oral surgeon, Cx grew micro-aerophilic strep.  She was seen in ID on 7-1 and was started on vanco/invanz. She was to receive 42 of this, completed ~ 3 weeks ago (07-25-15). PIC removed.  Still has some mild pain in her mandible where it was drained.  No f/c. No gum lesions or wounds.   Review of Systems  Constitutional: Negative for fever, chills, appetite change and unexpected weight change.  Gastrointestinal: Negative for constipation and blood in stool.  Genitourinary: Negative for difficulty urinating.  has been trying to increase her protein.  12 point ROS o/w negative.      Objective:   Physical Exam  Constitutional: She appears well-developed and well-nourished.  HENT:  Mouth/Throat: No oropharyngeal exudate.  Eyes: EOM are normal. Pupils are equal, round, and reactive to light.  Neck: Neck supple.  Lymphadenopathy:    She has no cervical adenopathy.      Assessment & Plan:

## 2015-08-14 NOTE — Assessment & Plan Note (Addendum)
She will see Dr Benson Norway tomorrow and he will make decisions with regard to further imaging of her jaw (panorex vs CT).  WIll check her ESR and CRP today.  She is doing very well. Her wound in her mandible is well healed. Her jaw is non-tender, no fluctuance on my exam.  Will see her back prn.

## 2015-08-15 DIAGNOSIS — M272 Inflammatory conditions of jaws: Secondary | ICD-10-CM | POA: Diagnosis not present

## 2015-08-15 LAB — SEDIMENTATION RATE: Sed Rate: 1 mm/hr (ref 0–30)

## 2015-09-25 IMAGING — XA IR FLUORO GUIDE CV LINE*R*
1 series · 2 of 2 positions shown · non-contrast
Comparison: none

CLINICAL DATA: MANDIBLE OSTEOMYELITIS

[Series 1: run · 2 of 2 slices shown]
[im 1/2]
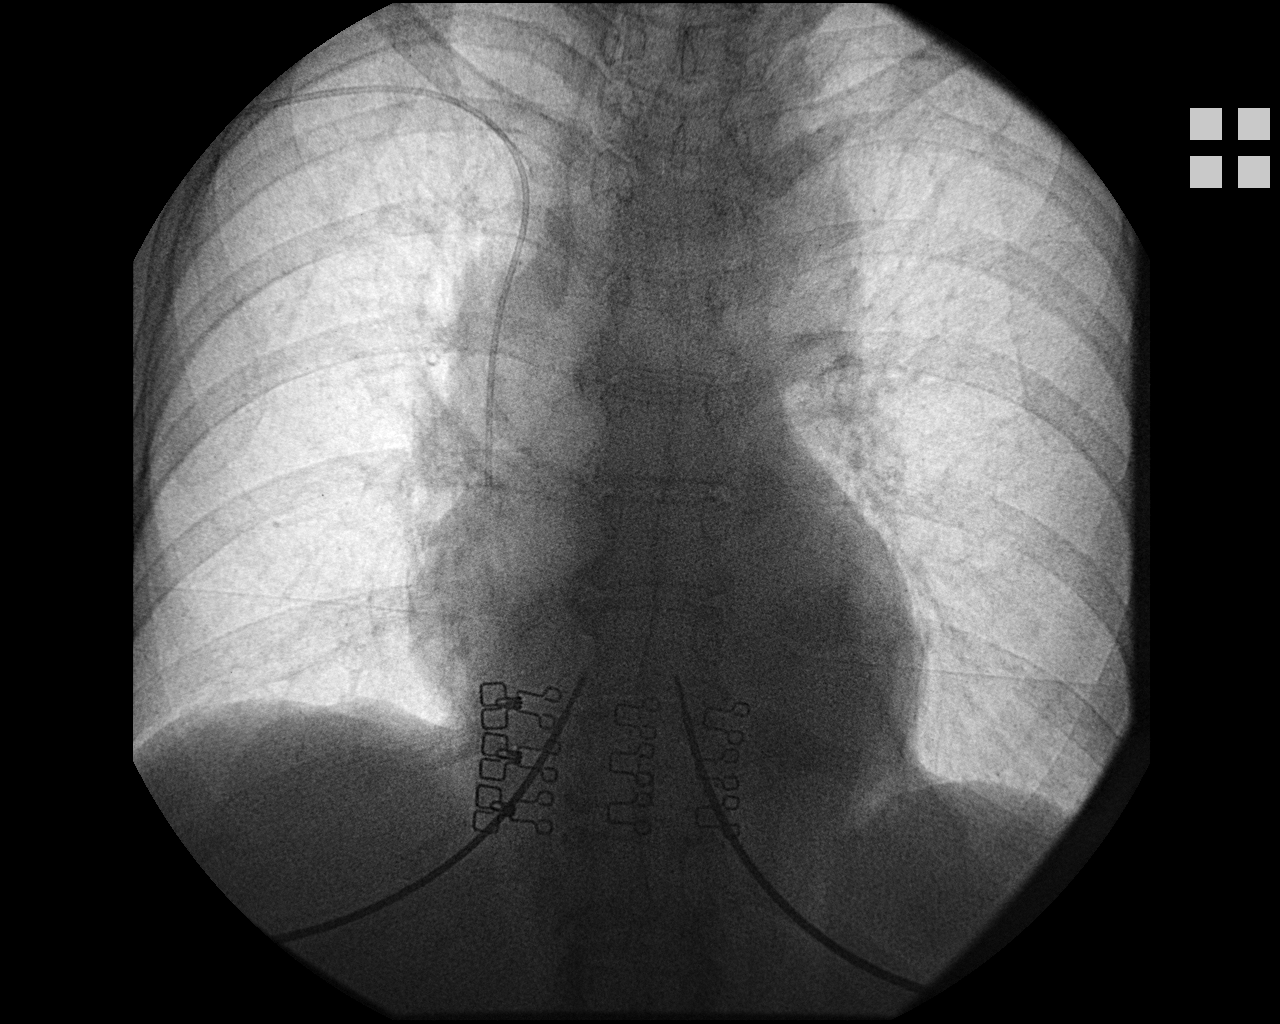
[im 2/2]
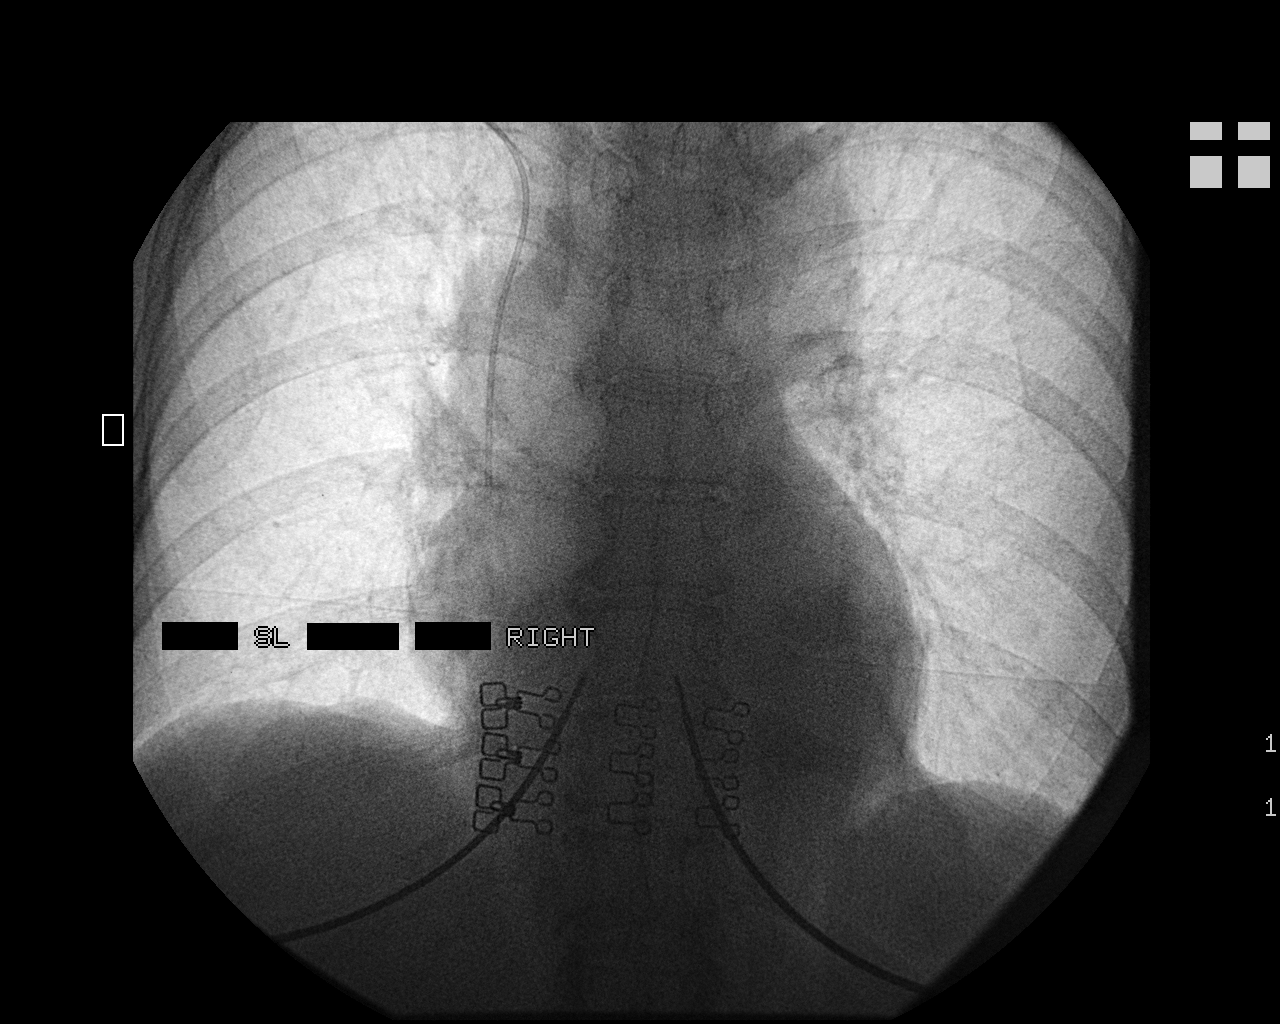

[2 of 2 positions shown; findings below may reference images not displayed]

EXAM:
RIGHT UPPER EXTREMITY SINGLE LUMEN POWER PICC LINE PLACEMENT WITH
ULTRASOUND AND FLUOROSCOPIC GUIDANCE

FLUOROSCOPY TIME:  6 SECONDS

PROCEDURE:
The patient was advised of the possible risks and complications and
agreed to undergo the procedure. The patient was then brought to the
angiographic suite for the procedure.

The right arm was prepped with chlorhexidine, draped in the usual
sterile fashion using maximum barrier technique (cap and mask,
sterile gown, sterile gloves, large sterile sheet, hand hygiene and
cutaneous antisepsis) and infiltrated locally with 1% Lidocaine.

Ultrasound demonstrated patency of the right basilic vein, and this
was documented with an image. Under real-time ultrasound guidance,
this vein was accessed with a 21 gauge micropuncture needle and
image documentation was performed. A [DATE] wire was introduced in to
the vein. Over this, a 5 French double lumen power PICC was advanced
to the lower SVC/right atrial junction. Fluoroscopy during the
procedure and fluoro spot radiograph confirms appropriate catheter
position. The catheter was flushed and covered with a sterile
dressing.

Catheter length: 35

Complications: None immediate
IMPRESSION: Successful right arm power PICC line placement with ultrasound and
fluoroscopic guidance. The catheter is ready for use.

## 2015-09-25 IMAGING — US IR FLUORO GUIDE CV LINE*R*
1 series · 3 of 3 positions shown · non-contrast
Comparison: none

CLINICAL DATA: MANDIBLE OSTEOMYELITIS

[Series 1: ir fluoro/shunt/fist · 3 of 3 slices shown]
[im 1/3]
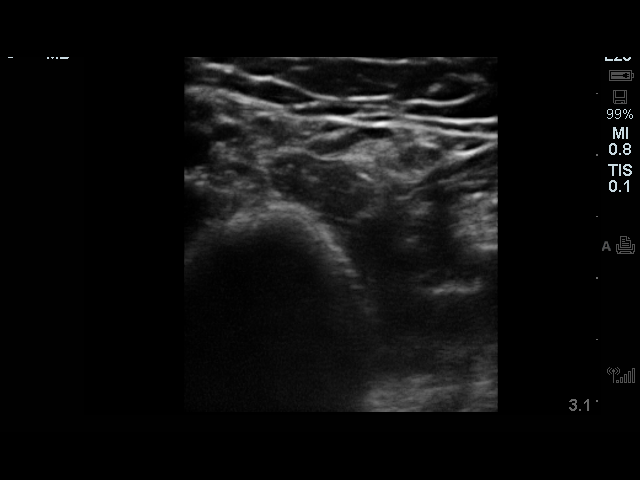
[im 2/3]
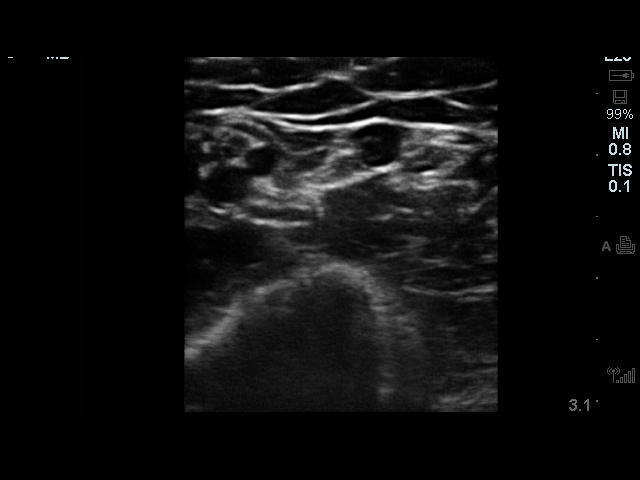
[im 3/3]
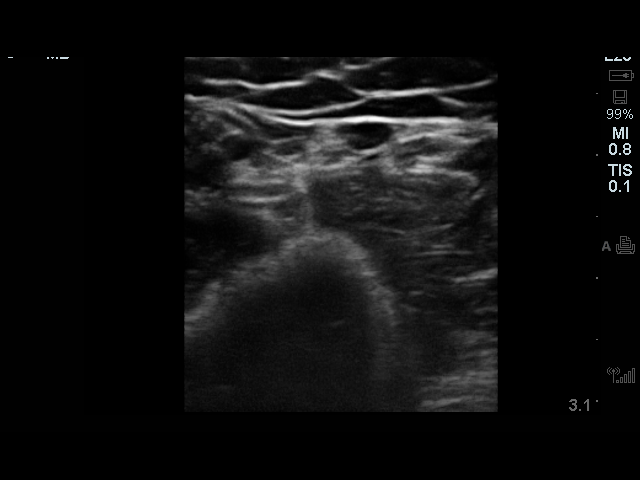

[3 of 3 positions shown; findings below may reference images not displayed]

EXAM:
RIGHT UPPER EXTREMITY SINGLE LUMEN POWER PICC LINE PLACEMENT WITH
ULTRASOUND AND FLUOROSCOPIC GUIDANCE

FLUOROSCOPY TIME:  6 SECONDS

PROCEDURE:
The patient was advised of the possible risks and complications and
agreed to undergo the procedure. The patient was then brought to the
angiographic suite for the procedure.

The right arm was prepped with chlorhexidine, draped in the usual
sterile fashion using maximum barrier technique (cap and mask,
sterile gown, sterile gloves, large sterile sheet, hand hygiene and
cutaneous antisepsis) and infiltrated locally with 1% Lidocaine.

Ultrasound demonstrated patency of the right basilic vein, and this
was documented with an image. Under real-time ultrasound guidance,
this vein was accessed with a 21 gauge micropuncture needle and
image documentation was performed. A [DATE] wire was introduced in to
the vein. Over this, a 5 French double lumen power PICC was advanced
to the lower SVC/right atrial junction. Fluoroscopy during the
procedure and fluoro spot radiograph confirms appropriate catheter
position. The catheter was flushed and covered with a sterile
dressing.

Catheter length: 35

Complications: None immediate
IMPRESSION: Successful right arm power PICC line placement with ultrasound and
fluoroscopic guidance. The catheter is ready for use.

## 2015-10-24 DIAGNOSIS — I10 Essential (primary) hypertension: Secondary | ICD-10-CM | POA: Diagnosis not present

## 2015-10-24 DIAGNOSIS — E559 Vitamin D deficiency, unspecified: Secondary | ICD-10-CM | POA: Diagnosis not present

## 2015-10-31 ENCOUNTER — Encounter: Payer: Self-pay | Admitting: Infectious Diseases

## 2015-11-11 ENCOUNTER — Encounter: Payer: Self-pay | Admitting: Infectious Diseases

## 2016-02-27 DIAGNOSIS — I10 Essential (primary) hypertension: Secondary | ICD-10-CM | POA: Diagnosis not present

## 2016-02-27 DIAGNOSIS — M958 Other specified acquired deformities of musculoskeletal system: Secondary | ICD-10-CM | POA: Diagnosis not present

## 2016-02-27 DIAGNOSIS — G8911 Acute pain due to trauma: Secondary | ICD-10-CM | POA: Diagnosis not present

## 2016-02-27 DIAGNOSIS — M19011 Primary osteoarthritis, right shoulder: Secondary | ICD-10-CM | POA: Diagnosis not present

## 2016-02-27 DIAGNOSIS — Z87891 Personal history of nicotine dependence: Secondary | ICD-10-CM | POA: Diagnosis not present

## 2016-02-27 DIAGNOSIS — M6281 Muscle weakness (generalized): Secondary | ICD-10-CM | POA: Diagnosis not present

## 2016-02-27 DIAGNOSIS — M25511 Pain in right shoulder: Secondary | ICD-10-CM | POA: Diagnosis not present

## 2016-02-27 DIAGNOSIS — I2510 Atherosclerotic heart disease of native coronary artery without angina pectoris: Secondary | ICD-10-CM | POA: Diagnosis not present

## 2016-02-27 DIAGNOSIS — Y9323 Activity, snow (alpine) (downhill) skiing, snow boarding, sledding, tobogganing and snow tubing: Secondary | ICD-10-CM | POA: Diagnosis not present

## 2016-02-27 DIAGNOSIS — M85811 Other specified disorders of bone density and structure, right shoulder: Secondary | ICD-10-CM | POA: Diagnosis not present

## 2016-02-27 DIAGNOSIS — M85821 Other specified disorders of bone density and structure, right upper arm: Secondary | ICD-10-CM | POA: Diagnosis not present

## 2016-03-11 DIAGNOSIS — S46911A Strain of unspecified muscle, fascia and tendon at shoulder and upper arm level, right arm, initial encounter: Secondary | ICD-10-CM | POA: Diagnosis not present

## 2016-03-16 DIAGNOSIS — S46911A Strain of unspecified muscle, fascia and tendon at shoulder and upper arm level, right arm, initial encounter: Secondary | ICD-10-CM | POA: Diagnosis not present

## 2016-03-18 DIAGNOSIS — M7541 Impingement syndrome of right shoulder: Secondary | ICD-10-CM | POA: Diagnosis not present

## 2016-05-07 ENCOUNTER — Other Ambulatory Visit: Payer: Self-pay | Admitting: Internal Medicine

## 2016-05-07 DIAGNOSIS — R6884 Jaw pain: Secondary | ICD-10-CM | POA: Diagnosis not present

## 2016-05-08 ENCOUNTER — Other Ambulatory Visit: Payer: Self-pay

## 2016-05-13 ENCOUNTER — Ambulatory Visit
Admission: RE | Admit: 2016-05-13 | Discharge: 2016-05-13 | Disposition: A | Discharge status: Home / Self Care | Payer: Medicare Other | Source: Ambulatory Visit | Attending: Internal Medicine | Admitting: Internal Medicine

## 2016-05-13 DIAGNOSIS — R6884 Jaw pain: Secondary | ICD-10-CM | POA: Diagnosis not present

## 2016-05-28 ENCOUNTER — Encounter: Payer: Self-pay | Admitting: Infectious Diseases

## 2016-06-01 ENCOUNTER — Ambulatory Visit (INDEPENDENT_AMBULATORY_CARE_PROVIDER_SITE_OTHER): Payer: Medicare Other | Admitting: Infectious Diseases

## 2016-06-01 ENCOUNTER — Encounter: Payer: Self-pay | Admitting: Infectious Diseases

## 2016-06-01 VITALS — BP 131/85 | HR 61 | Temp 97.4°F | Wt 118.0 lb

## 2016-06-01 DIAGNOSIS — M272 Inflammatory conditions of jaws: Secondary | ICD-10-CM | POA: Diagnosis not present

## 2016-06-01 NOTE — Progress Notes (Signed)
   Subjective:    Patient ID: [name], female    DOB: 1929/03/20, 82 y.o.   MRN: [id number]  HPI 25 F with hx of burkittt's lymphoma (1992, 2004), CAD with stent (2008), has been on zometa and then prolia. She had a molar removed in October 2015. By February, developed (1 week after prolia shot) hypersensitivity, pain. She had no breakdown of her skin or bone. She has had pain that has become progressive and across her mandible.  By 02-2015 she had CT showing localized osteomyelitis.  Since February she has been on clindamycin, 4 courses. Has had bx at her oral surgeon, Cx grew micro-aerophilic strep.  She was seen in ID on 7-1 and was started on vanco/invanz and was treated for 42 days.  She was seen by her PCP 6-2 with concerns about fever/chills, and that her jaw still ... On 6-1 she had CT scan showing chronic osteomyelitis of body and angle of L mandible. New 21m lytic lesion posterior to previous extraction site, suggesting intra-osseus abscess related chronic osteo. .Marland Kitchen She had a CRP 0.6, ESR 11 (05-15-16).  Today she feels like she has not "felt like herself" since she began treatment for this. She still has periods of soreness in her jaw intermittently. None for last 5 weeks.  Occasionally takes advil.  She has questions as to whether her jaw is still infected.   Review of Systems  Constitutional: Positive for fatigue. Negative for fever and chills.  HENT: Positive for mouth sores.   Psychiatric/Behavioral: Positive for sleep disturbance.  Has been having fever blisters since her previous infection.     Objective:   Physical Exam  Constitutional: She appears well-developed and well-nourished.  HENT:  Mouth/Throat: No oropharyngeal exudate.    Cardiovascular: Normal rate, regular rhythm and normal heart sounds.   Pulmonary/Chest: Effort normal and breath sounds normal.  Abdominal: Soft. Bowel sounds are normal. There is no tenderness. There is no rebound.    Lymphadenopathy:    She has no cervical adenopathy.       Assessment & Plan:

## 2016-06-01 NOTE — Assessment & Plan Note (Signed)
I suspect she may still have osteo of her jaw.  I need her to see her oral surgeon for possible debridement, cultures, biopsy.  Depending on her Cx, would consider restarting her atbx.  Will see her back in 3 weeks.

## 2016-06-02 ENCOUNTER — Telehealth: Payer: Self-pay

## 2016-06-02 NOTE — Telephone Encounter (Signed)
Called and notified patient of oral surgeon/denistry referral appointment with Dr. Gerhard Perches on June 29th at noon. Patient given telephone number and address. Informed patient that office was inside of same building as clinic but upstairs.  Referral faxed to [phone]. OK fax status returned. Rodman Key, LPN

## 2016-06-03 DIAGNOSIS — D485 Neoplasm of uncertain behavior of skin: Secondary | ICD-10-CM | POA: Diagnosis not present

## 2016-06-03 DIAGNOSIS — L570 Actinic keratosis: Secondary | ICD-10-CM | POA: Diagnosis not present

## 2016-06-03 DIAGNOSIS — C44311 Basal cell carcinoma of skin of nose: Secondary | ICD-10-CM | POA: Diagnosis not present

## 2016-06-03 DIAGNOSIS — Z85828 Personal history of other malignant neoplasm of skin: Secondary | ICD-10-CM | POA: Diagnosis not present

## 2016-06-22 ENCOUNTER — Ambulatory Visit: Payer: Medicare Other | Admitting: Infectious Diseases

## 2016-06-29 DIAGNOSIS — E559 Vitamin D deficiency, unspecified: Secondary | ICD-10-CM | POA: Diagnosis not present

## 2016-06-29 DIAGNOSIS — C8370 Burkitt lymphoma, unspecified site: Secondary | ICD-10-CM | POA: Diagnosis not present

## 2016-06-29 DIAGNOSIS — Z9861 Coronary angioplasty status: Secondary | ICD-10-CM | POA: Diagnosis not present

## 2016-06-29 DIAGNOSIS — K219 Gastro-esophageal reflux disease without esophagitis: Secondary | ICD-10-CM | POA: Diagnosis not present

## 2016-06-29 DIAGNOSIS — M1990 Unspecified osteoarthritis, unspecified site: Secondary | ICD-10-CM | POA: Diagnosis not present

## 2016-06-29 DIAGNOSIS — R6884 Jaw pain: Secondary | ICD-10-CM | POA: Diagnosis not present

## 2016-06-29 DIAGNOSIS — Z6821 Body mass index (BMI) 21.0-21.9, adult: Secondary | ICD-10-CM | POA: Diagnosis not present

## 2016-06-29 DIAGNOSIS — C189 Malignant neoplasm of colon, unspecified: Secondary | ICD-10-CM | POA: Diagnosis not present

## 2016-06-29 DIAGNOSIS — I10 Essential (primary) hypertension: Secondary | ICD-10-CM | POA: Diagnosis not present

## 2016-06-29 DIAGNOSIS — M8580 Other specified disorders of bone density and structure, unspecified site: Secondary | ICD-10-CM | POA: Diagnosis not present

## 2016-06-29 DIAGNOSIS — M869 Osteomyelitis, unspecified: Secondary | ICD-10-CM | POA: Diagnosis not present

## 2016-06-29 DIAGNOSIS — E784 Other hyperlipidemia: Secondary | ICD-10-CM | POA: Diagnosis not present

## 2016-07-10 DIAGNOSIS — L821 Other seborrheic keratosis: Secondary | ICD-10-CM | POA: Diagnosis not present

## 2016-07-10 DIAGNOSIS — Z85828 Personal history of other malignant neoplasm of skin: Secondary | ICD-10-CM | POA: Diagnosis not present

## 2016-07-22 ENCOUNTER — Ambulatory Visit: Payer: Medicare Other | Admitting: Infectious Diseases

## 2016-08-05 ENCOUNTER — Ambulatory Visit: Payer: Medicare Other | Admitting: Infectious Diseases

## 2016-09-15 DIAGNOSIS — L814 Other melanin hyperpigmentation: Secondary | ICD-10-CM | POA: Diagnosis not present

## 2016-09-15 DIAGNOSIS — D1801 Hemangioma of skin and subcutaneous tissue: Secondary | ICD-10-CM | POA: Diagnosis not present

## 2016-09-15 DIAGNOSIS — L821 Other seborrheic keratosis: Secondary | ICD-10-CM | POA: Diagnosis not present

## 2016-09-15 DIAGNOSIS — L570 Actinic keratosis: Secondary | ICD-10-CM | POA: Diagnosis not present

## 2016-09-15 DIAGNOSIS — L918 Other hypertrophic disorders of the skin: Secondary | ICD-10-CM | POA: Diagnosis not present

## 2016-09-15 DIAGNOSIS — L7211 Pilar cyst: Secondary | ICD-10-CM | POA: Diagnosis not present

## 2016-09-15 DIAGNOSIS — Z85828 Personal history of other malignant neoplasm of skin: Secondary | ICD-10-CM | POA: Diagnosis not present

## 2016-10-26 DIAGNOSIS — E784 Other hyperlipidemia: Secondary | ICD-10-CM | POA: Diagnosis not present

## 2016-10-26 DIAGNOSIS — E559 Vitamin D deficiency, unspecified: Secondary | ICD-10-CM | POA: Diagnosis not present

## 2016-10-26 DIAGNOSIS — I10 Essential (primary) hypertension: Secondary | ICD-10-CM | POA: Diagnosis not present

## 2016-11-30 DIAGNOSIS — Z1212 Encounter for screening for malignant neoplasm of rectum: Secondary | ICD-10-CM | POA: Diagnosis not present

## 2016-12-31 DIAGNOSIS — Z961 Presence of intraocular lens: Secondary | ICD-10-CM | POA: Diagnosis not present

## 2016-12-31 DIAGNOSIS — H04123 Dry eye syndrome of bilateral lacrimal glands: Secondary | ICD-10-CM | POA: Diagnosis not present

## 2016-12-31 DIAGNOSIS — H01001 Unspecified blepharitis right upper eyelid: Secondary | ICD-10-CM | POA: Diagnosis not present

## 2016-12-31 DIAGNOSIS — H524 Presbyopia: Secondary | ICD-10-CM | POA: Diagnosis not present

## 2017-02-15 DIAGNOSIS — L720 Epidermal cyst: Secondary | ICD-10-CM | POA: Diagnosis not present

## 2017-02-15 DIAGNOSIS — R55 Syncope and collapse: Secondary | ICD-10-CM | POA: Diagnosis not present

## 2017-02-15 DIAGNOSIS — L7211 Pilar cyst: Secondary | ICD-10-CM | POA: Diagnosis not present

## 2017-02-15 DIAGNOSIS — E038 Other specified hypothyroidism: Secondary | ICD-10-CM | POA: Diagnosis not present

## 2017-02-15 DIAGNOSIS — M545 Low back pain: Secondary | ICD-10-CM | POA: Diagnosis not present

## 2017-02-15 DIAGNOSIS — W19XXXA Unspecified fall, initial encounter: Secondary | ICD-10-CM | POA: Diagnosis not present

## 2017-02-15 DIAGNOSIS — Z6822 Body mass index (BMI) 22.0-22.9, adult: Secondary | ICD-10-CM | POA: Diagnosis not present

## 2017-02-15 DIAGNOSIS — Z85828 Personal history of other malignant neoplasm of skin: Secondary | ICD-10-CM | POA: Diagnosis not present

## 2017-09-16 DIAGNOSIS — D692 Other nonthrombocytopenic purpura: Secondary | ICD-10-CM | POA: Diagnosis not present

## 2017-09-16 DIAGNOSIS — D1801 Hemangioma of skin and subcutaneous tissue: Secondary | ICD-10-CM | POA: Diagnosis not present

## 2017-09-16 DIAGNOSIS — L821 Other seborrheic keratosis: Secondary | ICD-10-CM | POA: Diagnosis not present

## 2017-09-16 DIAGNOSIS — L7211 Pilar cyst: Secondary | ICD-10-CM | POA: Diagnosis not present

## 2017-09-16 DIAGNOSIS — Z85828 Personal history of other malignant neoplasm of skin: Secondary | ICD-10-CM | POA: Diagnosis not present

## 2017-11-08 DIAGNOSIS — I10 Essential (primary) hypertension: Secondary | ICD-10-CM | POA: Diagnosis not present

## 2017-11-08 DIAGNOSIS — E7849 Other hyperlipidemia: Secondary | ICD-10-CM | POA: Diagnosis not present

## 2017-11-08 DIAGNOSIS — E559 Vitamin D deficiency, unspecified: Secondary | ICD-10-CM | POA: Diagnosis not present

## 2017-11-08 DIAGNOSIS — R82998 Other abnormal findings in urine: Secondary | ICD-10-CM | POA: Diagnosis not present

## 2017-11-08 DIAGNOSIS — E038 Other specified hypothyroidism: Secondary | ICD-10-CM | POA: Diagnosis not present

## 2017-11-26 DIAGNOSIS — Z1212 Encounter for screening for malignant neoplasm of rectum: Secondary | ICD-10-CM | POA: Diagnosis not present

## 2018-09-22 DIAGNOSIS — L718 Other rosacea: Secondary | ICD-10-CM | POA: Diagnosis not present

## 2018-09-22 DIAGNOSIS — L7211 Pilar cyst: Secondary | ICD-10-CM | POA: Diagnosis not present

## 2018-09-22 DIAGNOSIS — L570 Actinic keratosis: Secondary | ICD-10-CM | POA: Diagnosis not present

## 2018-09-22 DIAGNOSIS — L814 Other melanin hyperpigmentation: Secondary | ICD-10-CM | POA: Diagnosis not present

## 2018-09-22 DIAGNOSIS — Z85828 Personal history of other malignant neoplasm of skin: Secondary | ICD-10-CM | POA: Diagnosis not present

## 2018-09-22 DIAGNOSIS — L821 Other seborrheic keratosis: Secondary | ICD-10-CM | POA: Diagnosis not present

## 2018-09-22 DIAGNOSIS — D1801 Hemangioma of skin and subcutaneous tissue: Secondary | ICD-10-CM | POA: Diagnosis not present

## 2018-09-22 DIAGNOSIS — L820 Inflamed seborrheic keratosis: Secondary | ICD-10-CM | POA: Diagnosis not present

## 2018-09-22 DIAGNOSIS — B078 Other viral warts: Secondary | ICD-10-CM | POA: Diagnosis not present

## 2018-11-07 DIAGNOSIS — Z85828 Personal history of other malignant neoplasm of skin: Secondary | ICD-10-CM | POA: Diagnosis not present

## 2018-11-07 DIAGNOSIS — C44311 Basal cell carcinoma of skin of nose: Secondary | ICD-10-CM | POA: Diagnosis not present

## 2018-11-16 DIAGNOSIS — E559 Vitamin D deficiency, unspecified: Secondary | ICD-10-CM | POA: Diagnosis not present

## 2018-11-16 DIAGNOSIS — I10 Essential (primary) hypertension: Secondary | ICD-10-CM | POA: Diagnosis not present

## 2018-11-16 DIAGNOSIS — E7849 Other hyperlipidemia: Secondary | ICD-10-CM | POA: Diagnosis not present

## 2018-11-16 DIAGNOSIS — R82998 Other abnormal findings in urine: Secondary | ICD-10-CM | POA: Diagnosis not present

## 2018-11-16 DIAGNOSIS — E038 Other specified hypothyroidism: Secondary | ICD-10-CM | POA: Diagnosis not present

## 2018-11-23 DIAGNOSIS — M859 Disorder of bone density and structure, unspecified: Secondary | ICD-10-CM | POA: Diagnosis not present

## 2018-11-23 DIAGNOSIS — C189 Malignant neoplasm of colon, unspecified: Secondary | ICD-10-CM | POA: Diagnosis not present

## 2018-11-23 DIAGNOSIS — Z0000 Encounter for general adult medical examination without abnormal findings: Secondary | ICD-10-CM | POA: Diagnosis not present

## 2018-11-23 DIAGNOSIS — Z1389 Encounter for screening for other disorder: Secondary | ICD-10-CM | POA: Diagnosis not present

## 2018-11-23 DIAGNOSIS — E7849 Other hyperlipidemia: Secondary | ICD-10-CM | POA: Diagnosis not present

## 2018-11-23 DIAGNOSIS — M545 Low back pain: Secondary | ICD-10-CM | POA: Diagnosis not present

## 2018-11-23 DIAGNOSIS — E038 Other specified hypothyroidism: Secondary | ICD-10-CM | POA: Diagnosis not present

## 2018-11-23 DIAGNOSIS — Z23 Encounter for immunization: Secondary | ICD-10-CM | POA: Diagnosis not present

## 2018-11-23 DIAGNOSIS — E559 Vitamin D deficiency, unspecified: Secondary | ICD-10-CM | POA: Diagnosis not present

## 2018-11-23 DIAGNOSIS — Z9861 Coronary angioplasty status: Secondary | ICD-10-CM | POA: Diagnosis not present

## 2018-11-23 DIAGNOSIS — I10 Essential (primary) hypertension: Secondary | ICD-10-CM | POA: Diagnosis not present

## 2018-11-23 DIAGNOSIS — M1990 Unspecified osteoarthritis, unspecified site: Secondary | ICD-10-CM | POA: Diagnosis not present

## 2018-11-25 DIAGNOSIS — Z1212 Encounter for screening for malignant neoplasm of rectum: Secondary | ICD-10-CM | POA: Diagnosis not present

## 2019-06-22 DIAGNOSIS — K219 Gastro-esophageal reflux disease without esophagitis: Secondary | ICD-10-CM | POA: Diagnosis not present

## 2019-06-22 DIAGNOSIS — C8370 Burkitt lymphoma, unspecified site: Secondary | ICD-10-CM | POA: Diagnosis not present

## 2019-06-22 DIAGNOSIS — M1990 Unspecified osteoarthritis, unspecified site: Secondary | ICD-10-CM | POA: Diagnosis not present

## 2019-06-22 DIAGNOSIS — M545 Low back pain: Secondary | ICD-10-CM | POA: Diagnosis not present

## 2019-06-22 DIAGNOSIS — C189 Malignant neoplasm of colon, unspecified: Secondary | ICD-10-CM | POA: Diagnosis not present

## 2019-06-22 DIAGNOSIS — E039 Hypothyroidism, unspecified: Secondary | ICD-10-CM | POA: Diagnosis not present

## 2019-06-22 DIAGNOSIS — Z9861 Coronary angioplasty status: Secondary | ICD-10-CM | POA: Diagnosis not present

## 2019-06-22 DIAGNOSIS — E785 Hyperlipidemia, unspecified: Secondary | ICD-10-CM | POA: Diagnosis not present

## 2019-06-22 DIAGNOSIS — E559 Vitamin D deficiency, unspecified: Secondary | ICD-10-CM | POA: Diagnosis not present

## 2019-06-22 DIAGNOSIS — I10 Essential (primary) hypertension: Secondary | ICD-10-CM | POA: Diagnosis not present

## 2019-06-22 DIAGNOSIS — M8580 Other specified disorders of bone density and structure, unspecified site: Secondary | ICD-10-CM | POA: Diagnosis not present

## 2019-09-21 DIAGNOSIS — Z23 Encounter for immunization: Secondary | ICD-10-CM | POA: Diagnosis not present

## 2019-09-27 DIAGNOSIS — D485 Neoplasm of uncertain behavior of skin: Secondary | ICD-10-CM | POA: Diagnosis not present

## 2019-09-27 DIAGNOSIS — D2372 Other benign neoplasm of skin of left lower limb, including hip: Secondary | ICD-10-CM | POA: Diagnosis not present

## 2019-09-27 DIAGNOSIS — L814 Other melanin hyperpigmentation: Secondary | ICD-10-CM | POA: Diagnosis not present

## 2019-09-27 DIAGNOSIS — Z85828 Personal history of other malignant neoplasm of skin: Secondary | ICD-10-CM | POA: Diagnosis not present

## 2019-09-27 DIAGNOSIS — D2271 Melanocytic nevi of right lower limb, including hip: Secondary | ICD-10-CM | POA: Diagnosis not present

## 2019-09-27 DIAGNOSIS — L718 Other rosacea: Secondary | ICD-10-CM | POA: Diagnosis not present

## 2019-09-27 DIAGNOSIS — L570 Actinic keratosis: Secondary | ICD-10-CM | POA: Diagnosis not present

## 2019-09-27 DIAGNOSIS — L7211 Pilar cyst: Secondary | ICD-10-CM | POA: Diagnosis not present

## 2019-09-27 DIAGNOSIS — D1801 Hemangioma of skin and subcutaneous tissue: Secondary | ICD-10-CM | POA: Diagnosis not present

## 2019-09-27 DIAGNOSIS — D2239 Melanocytic nevi of other parts of face: Secondary | ICD-10-CM | POA: Diagnosis not present

## 2019-09-27 DIAGNOSIS — L821 Other seborrheic keratosis: Secondary | ICD-10-CM | POA: Diagnosis not present

## 2019-11-08 DIAGNOSIS — I10 Essential (primary) hypertension: Secondary | ICD-10-CM | POA: Diagnosis not present

## 2019-11-08 DIAGNOSIS — B349 Viral infection, unspecified: Secondary | ICD-10-CM | POA: Diagnosis not present

## 2019-11-08 DIAGNOSIS — Z20818 Contact with and (suspected) exposure to other bacterial communicable diseases: Secondary | ICD-10-CM | POA: Diagnosis not present

## 2019-11-08 DIAGNOSIS — R6883 Chills (without fever): Secondary | ICD-10-CM | POA: Diagnosis not present

## 2019-12-12 DIAGNOSIS — I10 Essential (primary) hypertension: Secondary | ICD-10-CM | POA: Diagnosis not present

## 2019-12-30 ENCOUNTER — Ambulatory Visit: Payer: Medicare Other | Attending: Internal Medicine

## 2019-12-30 DIAGNOSIS — Z23 Encounter for immunization: Secondary | ICD-10-CM | POA: Diagnosis not present

## 2019-12-30 NOTE — Progress Notes (Signed)
   Covid-19 Vaccination Clinic  Name:  [name]    MRN: [id number] DOB: 1929/09/22  12/30/2019  Ms. [name] was observed post Covid-19 immunization for 15 minutes without incidence. She was provided with Vaccine Information Sheet and instruction to access the V-Safe system.   Ms. [name] was instructed to call 911 with any severe reactions post vaccine: Marland Kitchen Difficulty breathing  . Swelling of your face and throat  . A fast heartbeat  . A bad rash all over your body  . Dizziness and weakness    Immunizations Administered    Name Date Dose VIS Date Route   Pfizer COVID-19 Vaccine 12/30/2019  1:47 PM 0.3 mL 11/24/2019 Intramuscular   Manufacturer: [place]   Lot: [id number]   [place]: [id number]

## 2020-01-19 ENCOUNTER — Ambulatory Visit: Payer: Medicare Other | Attending: Internal Medicine

## 2020-01-19 DIAGNOSIS — Z23 Encounter for immunization: Secondary | ICD-10-CM | POA: Insufficient documentation

## 2020-01-19 NOTE — Progress Notes (Signed)
   Covid-19 Vaccination Clinic  Name:  [name]    MRN: [id number] DOB: Jul 08, 1929  01/19/2020  [name] was observed post Covid-19 immunization for 15 minutes without incidence. She was provided with Vaccine Information Sheet and instruction to access the V-Safe system.   [name] was instructed to call 911 with any severe reactions post vaccine: Marland Kitchen Difficulty breathing  . Swelling of your face and throat  . A fast heartbeat  . A bad rash all over your body  . Dizziness and weakness    Immunizations Administered    Name Date Dose VIS Date Route   Pfizer COVID-19 Vaccine 01/19/2020  3:43 PM 0.3 mL 11/24/2019 Intramuscular   Manufacturer: [place]   Lot: [id number]   [place]: [id number]

## 2020-01-30 DIAGNOSIS — Z20822 Contact with and (suspected) exposure to covid-19: Secondary | ICD-10-CM | POA: Diagnosis not present

## 2020-06-10 DIAGNOSIS — H903 Sensorineural hearing loss, bilateral: Secondary | ICD-10-CM | POA: Diagnosis not present

## 2020-07-10 DIAGNOSIS — Z9861 Coronary angioplasty status: Secondary | ICD-10-CM | POA: Diagnosis not present

## 2020-07-10 DIAGNOSIS — E038 Other specified hypothyroidism: Secondary | ICD-10-CM | POA: Diagnosis not present

## 2020-07-10 DIAGNOSIS — I10 Essential (primary) hypertension: Secondary | ICD-10-CM | POA: Diagnosis not present

## 2020-07-10 DIAGNOSIS — E7849 Other hyperlipidemia: Secondary | ICD-10-CM | POA: Diagnosis not present

## 2020-09-14 DIAGNOSIS — Z23 Encounter for immunization: Secondary | ICD-10-CM | POA: Diagnosis not present

## 2020-10-02 DIAGNOSIS — L570 Actinic keratosis: Secondary | ICD-10-CM | POA: Diagnosis not present

## 2020-10-02 DIAGNOSIS — L821 Other seborrheic keratosis: Secondary | ICD-10-CM | POA: Diagnosis not present

## 2020-10-02 DIAGNOSIS — L718 Other rosacea: Secondary | ICD-10-CM | POA: Diagnosis not present

## 2020-10-02 DIAGNOSIS — D1801 Hemangioma of skin and subcutaneous tissue: Secondary | ICD-10-CM | POA: Diagnosis not present

## 2020-10-02 DIAGNOSIS — L7211 Pilar cyst: Secondary | ICD-10-CM | POA: Diagnosis not present

## 2020-10-02 DIAGNOSIS — D692 Other nonthrombocytopenic purpura: Secondary | ICD-10-CM | POA: Diagnosis not present

## 2020-10-02 DIAGNOSIS — Z85828 Personal history of other malignant neoplasm of skin: Secondary | ICD-10-CM | POA: Diagnosis not present

## 2020-10-02 DIAGNOSIS — D2271 Melanocytic nevi of right lower limb, including hip: Secondary | ICD-10-CM | POA: Diagnosis not present

## 2020-10-08 DIAGNOSIS — Z23 Encounter for immunization: Secondary | ICD-10-CM | POA: Diagnosis not present

## 2020-12-23 DIAGNOSIS — M859 Disorder of bone density and structure, unspecified: Secondary | ICD-10-CM | POA: Diagnosis not present

## 2020-12-23 DIAGNOSIS — E785 Hyperlipidemia, unspecified: Secondary | ICD-10-CM | POA: Diagnosis not present

## 2020-12-23 DIAGNOSIS — E039 Hypothyroidism, unspecified: Secondary | ICD-10-CM | POA: Diagnosis not present

## 2021-01-29 DIAGNOSIS — M545 Low back pain, unspecified: Secondary | ICD-10-CM | POA: Diagnosis not present

## 2021-01-29 DIAGNOSIS — G8929 Other chronic pain: Secondary | ICD-10-CM | POA: Diagnosis not present

## 2021-01-29 DIAGNOSIS — I10 Essential (primary) hypertension: Secondary | ICD-10-CM | POA: Diagnosis not present

## 2021-01-29 DIAGNOSIS — M1990 Unspecified osteoarthritis, unspecified site: Secondary | ICD-10-CM | POA: Diagnosis not present

## 2021-01-29 DIAGNOSIS — K219 Gastro-esophageal reflux disease without esophagitis: Secondary | ICD-10-CM | POA: Diagnosis not present

## 2021-01-29 DIAGNOSIS — E039 Hypothyroidism, unspecified: Secondary | ICD-10-CM | POA: Diagnosis not present

## 2021-01-29 DIAGNOSIS — E559 Vitamin D deficiency, unspecified: Secondary | ICD-10-CM | POA: Diagnosis not present

## 2021-01-30 DIAGNOSIS — R82998 Other abnormal findings in urine: Secondary | ICD-10-CM | POA: Diagnosis not present

## 2021-01-30 DIAGNOSIS — I10 Essential (primary) hypertension: Secondary | ICD-10-CM | POA: Diagnosis not present

## 2021-02-24 DIAGNOSIS — I10 Essential (primary) hypertension: Secondary | ICD-10-CM | POA: Diagnosis not present

## 2021-02-24 DIAGNOSIS — J069 Acute upper respiratory infection, unspecified: Secondary | ICD-10-CM | POA: Diagnosis not present

## 2021-02-24 DIAGNOSIS — R059 Cough, unspecified: Secondary | ICD-10-CM | POA: Diagnosis not present

## 2021-04-03 DIAGNOSIS — U071 COVID-19: Secondary | ICD-10-CM | POA: Diagnosis not present

## 2021-04-03 DIAGNOSIS — R058 Other specified cough: Secondary | ICD-10-CM | POA: Diagnosis not present

## 2021-08-11 DIAGNOSIS — M545 Low back pain, unspecified: Secondary | ICD-10-CM | POA: Diagnosis not present

## 2021-08-11 DIAGNOSIS — I10 Essential (primary) hypertension: Secondary | ICD-10-CM | POA: Diagnosis not present

## 2021-08-11 DIAGNOSIS — E039 Hypothyroidism, unspecified: Secondary | ICD-10-CM | POA: Diagnosis not present

## 2021-08-11 DIAGNOSIS — E785 Hyperlipidemia, unspecified: Secondary | ICD-10-CM | POA: Diagnosis not present

## 2021-08-11 DIAGNOSIS — K219 Gastro-esophageal reflux disease without esophagitis: Secondary | ICD-10-CM | POA: Diagnosis not present

## 2021-10-06 DIAGNOSIS — Z23 Encounter for immunization: Secondary | ICD-10-CM | POA: Diagnosis not present

## 2021-10-08 DIAGNOSIS — L821 Other seborrheic keratosis: Secondary | ICD-10-CM | POA: Diagnosis not present

## 2021-10-08 DIAGNOSIS — L570 Actinic keratosis: Secondary | ICD-10-CM | POA: Diagnosis not present

## 2021-10-08 DIAGNOSIS — D1801 Hemangioma of skin and subcutaneous tissue: Secondary | ICD-10-CM | POA: Diagnosis not present

## 2021-10-08 DIAGNOSIS — Z85828 Personal history of other malignant neoplasm of skin: Secondary | ICD-10-CM | POA: Diagnosis not present

## 2021-10-08 DIAGNOSIS — L718 Other rosacea: Secondary | ICD-10-CM | POA: Diagnosis not present

## 2021-10-08 DIAGNOSIS — L814 Other melanin hyperpigmentation: Secondary | ICD-10-CM | POA: Diagnosis not present

## 2021-10-08 DIAGNOSIS — L7211 Pilar cyst: Secondary | ICD-10-CM | POA: Diagnosis not present

## 2022-01-05 DIAGNOSIS — K219 Gastro-esophageal reflux disease without esophagitis: Secondary | ICD-10-CM | POA: Diagnosis not present

## 2022-01-05 DIAGNOSIS — M8580 Other specified disorders of bone density and structure, unspecified site: Secondary | ICD-10-CM | POA: Diagnosis not present

## 2022-01-05 DIAGNOSIS — E039 Hypothyroidism, unspecified: Secondary | ICD-10-CM | POA: Diagnosis not present

## 2022-01-05 DIAGNOSIS — M545 Low back pain, unspecified: Secondary | ICD-10-CM | POA: Diagnosis not present

## 2022-01-05 DIAGNOSIS — I10 Essential (primary) hypertension: Secondary | ICD-10-CM | POA: Diagnosis not present

## 2022-02-18 DIAGNOSIS — E559 Vitamin D deficiency, unspecified: Secondary | ICD-10-CM | POA: Diagnosis not present

## 2022-02-18 DIAGNOSIS — E039 Hypothyroidism, unspecified: Secondary | ICD-10-CM | POA: Diagnosis not present

## 2022-02-18 DIAGNOSIS — E785 Hyperlipidemia, unspecified: Secondary | ICD-10-CM | POA: Diagnosis not present

## 2022-02-18 DIAGNOSIS — I10 Essential (primary) hypertension: Secondary | ICD-10-CM | POA: Diagnosis not present

## 2022-03-09 DIAGNOSIS — Z0000 Encounter for general adult medical examination without abnormal findings: Secondary | ICD-10-CM | POA: Diagnosis not present

## 2022-03-09 DIAGNOSIS — E785 Hyperlipidemia, unspecified: Secondary | ICD-10-CM | POA: Diagnosis not present

## 2022-03-09 DIAGNOSIS — M8580 Other specified disorders of bone density and structure, unspecified site: Secondary | ICD-10-CM | POA: Diagnosis not present

## 2022-03-09 DIAGNOSIS — Z1331 Encounter for screening for depression: Secondary | ICD-10-CM | POA: Diagnosis not present

## 2022-03-09 DIAGNOSIS — I10 Essential (primary) hypertension: Secondary | ICD-10-CM | POA: Diagnosis not present

## 2022-03-09 DIAGNOSIS — E559 Vitamin D deficiency, unspecified: Secondary | ICD-10-CM | POA: Diagnosis not present

## 2022-03-09 DIAGNOSIS — E039 Hypothyroidism, unspecified: Secondary | ICD-10-CM | POA: Diagnosis not present

## 2022-03-09 DIAGNOSIS — R82998 Other abnormal findings in urine: Secondary | ICD-10-CM | POA: Diagnosis not present

## 2022-03-09 DIAGNOSIS — M545 Low back pain, unspecified: Secondary | ICD-10-CM | POA: Diagnosis not present

## 2022-03-09 DIAGNOSIS — Z1339 Encounter for screening examination for other mental health and behavioral disorders: Secondary | ICD-10-CM | POA: Diagnosis not present

## 2022-03-19 DIAGNOSIS — M545 Low back pain, unspecified: Secondary | ICD-10-CM | POA: Diagnosis not present

## 2022-09-09 DIAGNOSIS — T148XXA Other injury of unspecified body region, initial encounter: Secondary | ICD-10-CM | POA: Diagnosis not present

## 2022-09-09 DIAGNOSIS — I10 Essential (primary) hypertension: Secondary | ICD-10-CM | POA: Diagnosis not present

## 2022-09-16 DIAGNOSIS — Z23 Encounter for immunization: Secondary | ICD-10-CM | POA: Diagnosis not present

## 2022-10-13 DIAGNOSIS — L821 Other seborrheic keratosis: Secondary | ICD-10-CM | POA: Diagnosis not present

## 2022-10-13 DIAGNOSIS — D1801 Hemangioma of skin and subcutaneous tissue: Secondary | ICD-10-CM | POA: Diagnosis not present

## 2022-10-13 DIAGNOSIS — D2272 Melanocytic nevi of left lower limb, including hip: Secondary | ICD-10-CM | POA: Diagnosis not present

## 2022-10-13 DIAGNOSIS — L570 Actinic keratosis: Secondary | ICD-10-CM | POA: Diagnosis not present

## 2022-10-13 DIAGNOSIS — Z85828 Personal history of other malignant neoplasm of skin: Secondary | ICD-10-CM | POA: Diagnosis not present

## 2022-10-13 DIAGNOSIS — L718 Other rosacea: Secondary | ICD-10-CM | POA: Diagnosis not present

## 2022-10-13 DIAGNOSIS — L7211 Pilar cyst: Secondary | ICD-10-CM | POA: Diagnosis not present

## 2023-01-06 DIAGNOSIS — I10 Essential (primary) hypertension: Secondary | ICD-10-CM | POA: Diagnosis not present

## 2023-01-06 DIAGNOSIS — U071 COVID-19: Secondary | ICD-10-CM | POA: Diagnosis not present

## 2023-01-06 DIAGNOSIS — R9389 Abnormal findings on diagnostic imaging of other specified body structures: Secondary | ICD-10-CM | POA: Diagnosis not present

## 2023-01-06 DIAGNOSIS — R051 Acute cough: Secondary | ICD-10-CM | POA: Diagnosis not present

## 2023-01-06 DIAGNOSIS — J069 Acute upper respiratory infection, unspecified: Secondary | ICD-10-CM | POA: Diagnosis not present

## 2023-01-18 DIAGNOSIS — J069 Acute upper respiratory infection, unspecified: Secondary | ICD-10-CM | POA: Diagnosis not present

## 2023-01-18 DIAGNOSIS — H9191 Unspecified hearing loss, right ear: Secondary | ICD-10-CM | POA: Diagnosis not present

## 2023-01-18 DIAGNOSIS — H6121 Impacted cerumen, right ear: Secondary | ICD-10-CM | POA: Diagnosis not present

## 2023-01-21 DIAGNOSIS — H6991 Unspecified Eustachian tube disorder, right ear: Secondary | ICD-10-CM | POA: Diagnosis not present

## 2023-03-09 DIAGNOSIS — K219 Gastro-esophageal reflux disease without esophagitis: Secondary | ICD-10-CM | POA: Diagnosis not present

## 2023-03-09 DIAGNOSIS — E559 Vitamin D deficiency, unspecified: Secondary | ICD-10-CM | POA: Diagnosis not present

## 2023-03-09 DIAGNOSIS — E039 Hypothyroidism, unspecified: Secondary | ICD-10-CM | POA: Diagnosis not present

## 2023-03-09 DIAGNOSIS — E785 Hyperlipidemia, unspecified: Secondary | ICD-10-CM | POA: Diagnosis not present

## 2023-03-15 DIAGNOSIS — E039 Hypothyroidism, unspecified: Secondary | ICD-10-CM | POA: Diagnosis not present

## 2023-03-15 DIAGNOSIS — E785 Hyperlipidemia, unspecified: Secondary | ICD-10-CM | POA: Diagnosis not present

## 2023-03-15 DIAGNOSIS — I10 Essential (primary) hypertension: Secondary | ICD-10-CM | POA: Diagnosis not present

## 2023-03-15 DIAGNOSIS — Z1331 Encounter for screening for depression: Secondary | ICD-10-CM | POA: Diagnosis not present

## 2023-03-15 DIAGNOSIS — Z1339 Encounter for screening examination for other mental health and behavioral disorders: Secondary | ICD-10-CM | POA: Diagnosis not present

## 2023-03-15 DIAGNOSIS — U099 Post covid-19 condition, unspecified: Secondary | ICD-10-CM | POA: Diagnosis not present

## 2023-03-15 DIAGNOSIS — Z0000 Encounter for general adult medical examination without abnormal findings: Secondary | ICD-10-CM | POA: Diagnosis not present

## 2023-03-15 DIAGNOSIS — E559 Vitamin D deficiency, unspecified: Secondary | ICD-10-CM | POA: Diagnosis not present

## 2023-03-15 DIAGNOSIS — M4003 Postural kyphosis, cervicothoracic region: Secondary | ICD-10-CM | POA: Diagnosis not present

## 2023-09-13 DIAGNOSIS — K219 Gastro-esophageal reflux disease without esophagitis: Secondary | ICD-10-CM | POA: Diagnosis not present

## 2023-09-13 DIAGNOSIS — I10 Essential (primary) hypertension: Secondary | ICD-10-CM | POA: Diagnosis not present

## 2023-09-13 DIAGNOSIS — Z23 Encounter for immunization: Secondary | ICD-10-CM | POA: Diagnosis not present

## 2023-09-13 DIAGNOSIS — C189 Malignant neoplasm of colon, unspecified: Secondary | ICD-10-CM | POA: Diagnosis not present

## 2023-09-13 DIAGNOSIS — E039 Hypothyroidism, unspecified: Secondary | ICD-10-CM | POA: Diagnosis not present

## 2023-09-13 DIAGNOSIS — M1990 Unspecified osteoarthritis, unspecified site: Secondary | ICD-10-CM | POA: Diagnosis not present

## 2023-09-13 DIAGNOSIS — E559 Vitamin D deficiency, unspecified: Secondary | ICD-10-CM | POA: Diagnosis not present

## 2023-10-08 DIAGNOSIS — H6122 Impacted cerumen, left ear: Secondary | ICD-10-CM | POA: Diagnosis not present

## 2023-10-21 DIAGNOSIS — Z23 Encounter for immunization: Secondary | ICD-10-CM | POA: Diagnosis not present

## 2023-11-18 DIAGNOSIS — R509 Fever, unspecified: Secondary | ICD-10-CM | POA: Diagnosis not present

## 2023-11-18 DIAGNOSIS — Z1152 Encounter for screening for COVID-19: Secondary | ICD-10-CM | POA: Diagnosis not present

## 2023-11-18 DIAGNOSIS — R0602 Shortness of breath: Secondary | ICD-10-CM | POA: Diagnosis not present

## 2023-11-18 DIAGNOSIS — R5383 Other fatigue: Secondary | ICD-10-CM | POA: Diagnosis not present

## 2023-11-18 DIAGNOSIS — R058 Other specified cough: Secondary | ICD-10-CM | POA: Diagnosis not present

## 2023-11-18 DIAGNOSIS — J029 Acute pharyngitis, unspecified: Secondary | ICD-10-CM | POA: Diagnosis not present

## 2023-11-18 DIAGNOSIS — J069 Acute upper respiratory infection, unspecified: Secondary | ICD-10-CM | POA: Diagnosis not present

## 2023-12-02 DIAGNOSIS — L814 Other melanin hyperpigmentation: Secondary | ICD-10-CM | POA: Diagnosis not present

## 2023-12-02 DIAGNOSIS — L718 Other rosacea: Secondary | ICD-10-CM | POA: Diagnosis not present

## 2023-12-02 DIAGNOSIS — Z85828 Personal history of other malignant neoplasm of skin: Secondary | ICD-10-CM | POA: Diagnosis not present

## 2023-12-02 DIAGNOSIS — D2272 Melanocytic nevi of left lower limb, including hip: Secondary | ICD-10-CM | POA: Diagnosis not present

## 2023-12-02 DIAGNOSIS — L7211 Pilar cyst: Secondary | ICD-10-CM | POA: Diagnosis not present

## 2023-12-02 DIAGNOSIS — L821 Other seborrheic keratosis: Secondary | ICD-10-CM | POA: Diagnosis not present

## 2023-12-02 DIAGNOSIS — D1801 Hemangioma of skin and subcutaneous tissue: Secondary | ICD-10-CM | POA: Diagnosis not present

## 2023-12-02 DIAGNOSIS — L570 Actinic keratosis: Secondary | ICD-10-CM | POA: Diagnosis not present

## 2024-03-13 DIAGNOSIS — E039 Hypothyroidism, unspecified: Secondary | ICD-10-CM | POA: Diagnosis not present

## 2024-03-13 DIAGNOSIS — I10 Essential (primary) hypertension: Secondary | ICD-10-CM | POA: Diagnosis not present

## 2024-03-13 DIAGNOSIS — E785 Hyperlipidemia, unspecified: Secondary | ICD-10-CM | POA: Diagnosis not present

## 2024-03-13 DIAGNOSIS — E559 Vitamin D deficiency, unspecified: Secondary | ICD-10-CM | POA: Diagnosis not present

## 2024-03-20 DIAGNOSIS — E785 Hyperlipidemia, unspecified: Secondary | ICD-10-CM | POA: Diagnosis not present

## 2024-03-20 DIAGNOSIS — Z1339 Encounter for screening examination for other mental health and behavioral disorders: Secondary | ICD-10-CM | POA: Diagnosis not present

## 2024-03-20 DIAGNOSIS — Z0000 Encounter for general adult medical examination without abnormal findings: Secondary | ICD-10-CM | POA: Diagnosis not present

## 2024-03-20 DIAGNOSIS — C8370 Burkitt lymphoma, unspecified site: Secondary | ICD-10-CM | POA: Diagnosis not present

## 2024-03-20 DIAGNOSIS — Z1331 Encounter for screening for depression: Secondary | ICD-10-CM | POA: Diagnosis not present

## 2024-03-20 DIAGNOSIS — E559 Vitamin D deficiency, unspecified: Secondary | ICD-10-CM | POA: Diagnosis not present

## 2024-03-20 DIAGNOSIS — K219 Gastro-esophageal reflux disease without esophagitis: Secondary | ICD-10-CM | POA: Diagnosis not present

## 2024-03-20 DIAGNOSIS — I10 Essential (primary) hypertension: Secondary | ICD-10-CM | POA: Diagnosis not present

## 2024-03-20 DIAGNOSIS — E039 Hypothyroidism, unspecified: Secondary | ICD-10-CM | POA: Diagnosis not present

## 2024-03-20 DIAGNOSIS — M4003 Postural kyphosis, cervicothoracic region: Secondary | ICD-10-CM | POA: Diagnosis not present

## 2024-03-24 DIAGNOSIS — N390 Urinary tract infection, site not specified: Secondary | ICD-10-CM | POA: Diagnosis not present

## 2024-04-03 DIAGNOSIS — N390 Urinary tract infection, site not specified: Secondary | ICD-10-CM | POA: Diagnosis not present

## 2024-04-10 DIAGNOSIS — N390 Urinary tract infection, site not specified: Secondary | ICD-10-CM | POA: Diagnosis not present

## 2024-04-10 DIAGNOSIS — R8281 Pyuria: Secondary | ICD-10-CM | POA: Diagnosis not present

## 2024-05-18 DIAGNOSIS — M549 Dorsalgia, unspecified: Secondary | ICD-10-CM | POA: Diagnosis not present

## 2024-05-18 DIAGNOSIS — N390 Urinary tract infection, site not specified: Secondary | ICD-10-CM | POA: Diagnosis not present

## 2024-06-20 DIAGNOSIS — N200 Calculus of kidney: Secondary | ICD-10-CM | POA: Diagnosis not present

## 2024-06-20 DIAGNOSIS — M545 Low back pain, unspecified: Secondary | ICD-10-CM | POA: Diagnosis not present

## 2024-06-20 DIAGNOSIS — C189 Malignant neoplasm of colon, unspecified: Secondary | ICD-10-CM | POA: Diagnosis not present

## 2024-06-20 DIAGNOSIS — N390 Urinary tract infection, site not specified: Secondary | ICD-10-CM | POA: Diagnosis not present

## 2024-06-20 DIAGNOSIS — M8580 Other specified disorders of bone density and structure, unspecified site: Secondary | ICD-10-CM | POA: Diagnosis not present

## 2024-06-20 DIAGNOSIS — R319 Hematuria, unspecified: Secondary | ICD-10-CM | POA: Diagnosis not present

## 2024-06-20 DIAGNOSIS — C8370 Burkitt lymphoma, unspecified site: Secondary | ICD-10-CM | POA: Diagnosis not present

## 2024-06-28 DIAGNOSIS — M4003 Postural kyphosis, cervicothoracic region: Secondary | ICD-10-CM | POA: Diagnosis not present

## 2024-06-28 DIAGNOSIS — M4150 Other secondary scoliosis, site unspecified: Secondary | ICD-10-CM | POA: Diagnosis not present

## 2024-06-28 DIAGNOSIS — I10 Essential (primary) hypertension: Secondary | ICD-10-CM | POA: Diagnosis not present

## 2024-06-28 DIAGNOSIS — N390 Urinary tract infection, site not specified: Secondary | ICD-10-CM | POA: Diagnosis not present

## 2024-06-28 DIAGNOSIS — N200 Calculus of kidney: Secondary | ICD-10-CM | POA: Diagnosis not present

## 2024-06-29 DIAGNOSIS — N390 Urinary tract infection, site not specified: Secondary | ICD-10-CM | POA: Diagnosis not present

## 2024-06-30 DIAGNOSIS — Z90710 Acquired absence of both cervix and uterus: Secondary | ICD-10-CM | POA: Diagnosis not present

## 2024-06-30 DIAGNOSIS — R319 Hematuria, unspecified: Secondary | ICD-10-CM | POA: Diagnosis not present

## 2024-06-30 DIAGNOSIS — Z8781 Personal history of (healed) traumatic fracture: Secondary | ICD-10-CM | POA: Diagnosis not present

## 2024-06-30 DIAGNOSIS — Z7982 Long term (current) use of aspirin: Secondary | ICD-10-CM | POA: Diagnosis not present

## 2024-06-30 DIAGNOSIS — Z8616 Personal history of COVID-19: Secondary | ICD-10-CM | POA: Diagnosis not present

## 2024-06-30 DIAGNOSIS — Z9089 Acquired absence of other organs: Secondary | ICD-10-CM | POA: Diagnosis not present

## 2024-06-30 DIAGNOSIS — N200 Calculus of kidney: Secondary | ICD-10-CM | POA: Diagnosis not present

## 2024-06-30 DIAGNOSIS — I10 Essential (primary) hypertension: Secondary | ICD-10-CM | POA: Diagnosis not present

## 2024-06-30 DIAGNOSIS — M4317 Spondylolisthesis, lumbosacral region: Secondary | ICD-10-CM | POA: Diagnosis not present

## 2024-06-30 DIAGNOSIS — H9193 Unspecified hearing loss, bilateral: Secondary | ICD-10-CM | POA: Diagnosis not present

## 2024-06-30 DIAGNOSIS — M47817 Spondylosis without myelopathy or radiculopathy, lumbosacral region: Secondary | ICD-10-CM | POA: Diagnosis not present

## 2024-06-30 DIAGNOSIS — C189 Malignant neoplasm of colon, unspecified: Secondary | ICD-10-CM | POA: Diagnosis not present

## 2024-06-30 DIAGNOSIS — Z9049 Acquired absence of other specified parts of digestive tract: Secondary | ICD-10-CM | POA: Diagnosis not present

## 2024-06-30 DIAGNOSIS — M1990 Unspecified osteoarthritis, unspecified site: Secondary | ICD-10-CM | POA: Diagnosis not present

## 2024-06-30 DIAGNOSIS — E785 Hyperlipidemia, unspecified: Secondary | ICD-10-CM | POA: Diagnosis not present

## 2024-06-30 DIAGNOSIS — N390 Urinary tract infection, site not specified: Secondary | ICD-10-CM | POA: Diagnosis not present

## 2024-06-30 DIAGNOSIS — R32 Unspecified urinary incontinence: Secondary | ICD-10-CM | POA: Diagnosis not present

## 2024-06-30 DIAGNOSIS — Z556 Problems related to health literacy: Secondary | ICD-10-CM | POA: Diagnosis not present

## 2024-06-30 DIAGNOSIS — I252 Old myocardial infarction: Secondary | ICD-10-CM | POA: Diagnosis not present

## 2024-06-30 DIAGNOSIS — M810 Age-related osteoporosis without current pathological fracture: Secondary | ICD-10-CM | POA: Diagnosis not present

## 2024-06-30 DIAGNOSIS — Z860101 Personal history of adenomatous and serrated colon polyps: Secondary | ICD-10-CM | POA: Diagnosis not present

## 2024-06-30 DIAGNOSIS — M4155 Other secondary scoliosis, thoracolumbar region: Secondary | ICD-10-CM | POA: Diagnosis not present

## 2024-06-30 DIAGNOSIS — M47815 Spondylosis without myelopathy or radiculopathy, thoracolumbar region: Secondary | ICD-10-CM | POA: Diagnosis not present

## 2024-06-30 DIAGNOSIS — I2510 Atherosclerotic heart disease of native coronary artery without angina pectoris: Secondary | ICD-10-CM | POA: Diagnosis not present

## 2024-06-30 DIAGNOSIS — C8370 Burkitt lymphoma, unspecified site: Secondary | ICD-10-CM | POA: Diagnosis not present

## 2024-07-05 DIAGNOSIS — M4155 Other secondary scoliosis, thoracolumbar region: Secondary | ICD-10-CM | POA: Diagnosis not present

## 2024-07-05 DIAGNOSIS — M47815 Spondylosis without myelopathy or radiculopathy, thoracolumbar region: Secondary | ICD-10-CM | POA: Diagnosis not present

## 2024-07-05 DIAGNOSIS — M47817 Spondylosis without myelopathy or radiculopathy, lumbosacral region: Secondary | ICD-10-CM | POA: Diagnosis not present

## 2024-07-05 DIAGNOSIS — C189 Malignant neoplasm of colon, unspecified: Secondary | ICD-10-CM | POA: Diagnosis not present

## 2024-07-05 DIAGNOSIS — M4317 Spondylolisthesis, lumbosacral region: Secondary | ICD-10-CM | POA: Diagnosis not present

## 2024-07-05 DIAGNOSIS — C8370 Burkitt lymphoma, unspecified site: Secondary | ICD-10-CM | POA: Diagnosis not present

## 2024-07-07 DIAGNOSIS — M47817 Spondylosis without myelopathy or radiculopathy, lumbosacral region: Secondary | ICD-10-CM | POA: Diagnosis not present

## 2024-07-07 DIAGNOSIS — M4317 Spondylolisthesis, lumbosacral region: Secondary | ICD-10-CM | POA: Diagnosis not present

## 2024-07-07 DIAGNOSIS — M4155 Other secondary scoliosis, thoracolumbar region: Secondary | ICD-10-CM | POA: Diagnosis not present

## 2024-07-07 DIAGNOSIS — C8370 Burkitt lymphoma, unspecified site: Secondary | ICD-10-CM | POA: Diagnosis not present

## 2024-07-07 DIAGNOSIS — C189 Malignant neoplasm of colon, unspecified: Secondary | ICD-10-CM | POA: Diagnosis not present

## 2024-07-07 DIAGNOSIS — M47815 Spondylosis without myelopathy or radiculopathy, thoracolumbar region: Secondary | ICD-10-CM | POA: Diagnosis not present

## 2024-07-10 DIAGNOSIS — M4317 Spondylolisthesis, lumbosacral region: Secondary | ICD-10-CM | POA: Diagnosis not present

## 2024-07-10 DIAGNOSIS — C189 Malignant neoplasm of colon, unspecified: Secondary | ICD-10-CM | POA: Diagnosis not present

## 2024-07-10 DIAGNOSIS — M47815 Spondylosis without myelopathy or radiculopathy, thoracolumbar region: Secondary | ICD-10-CM | POA: Diagnosis not present

## 2024-07-10 DIAGNOSIS — C8370 Burkitt lymphoma, unspecified site: Secondary | ICD-10-CM | POA: Diagnosis not present

## 2024-07-10 DIAGNOSIS — M4155 Other secondary scoliosis, thoracolumbar region: Secondary | ICD-10-CM | POA: Diagnosis not present

## 2024-07-10 DIAGNOSIS — M47817 Spondylosis without myelopathy or radiculopathy, lumbosacral region: Secondary | ICD-10-CM | POA: Diagnosis not present

## 2024-07-12 DIAGNOSIS — C189 Malignant neoplasm of colon, unspecified: Secondary | ICD-10-CM | POA: Diagnosis not present

## 2024-07-12 DIAGNOSIS — C8370 Burkitt lymphoma, unspecified site: Secondary | ICD-10-CM | POA: Diagnosis not present

## 2024-07-12 DIAGNOSIS — M4317 Spondylolisthesis, lumbosacral region: Secondary | ICD-10-CM | POA: Diagnosis not present

## 2024-07-12 DIAGNOSIS — M47817 Spondylosis without myelopathy or radiculopathy, lumbosacral region: Secondary | ICD-10-CM | POA: Diagnosis not present

## 2024-07-12 DIAGNOSIS — M4155 Other secondary scoliosis, thoracolumbar region: Secondary | ICD-10-CM | POA: Diagnosis not present

## 2024-07-12 DIAGNOSIS — M47815 Spondylosis without myelopathy or radiculopathy, thoracolumbar region: Secondary | ICD-10-CM | POA: Diagnosis not present

## 2024-07-17 DIAGNOSIS — M47815 Spondylosis without myelopathy or radiculopathy, thoracolumbar region: Secondary | ICD-10-CM | POA: Diagnosis not present

## 2024-07-17 DIAGNOSIS — M4317 Spondylolisthesis, lumbosacral region: Secondary | ICD-10-CM | POA: Diagnosis not present

## 2024-07-17 DIAGNOSIS — C189 Malignant neoplasm of colon, unspecified: Secondary | ICD-10-CM | POA: Diagnosis not present

## 2024-07-17 DIAGNOSIS — M4155 Other secondary scoliosis, thoracolumbar region: Secondary | ICD-10-CM | POA: Diagnosis not present

## 2024-07-17 DIAGNOSIS — C8370 Burkitt lymphoma, unspecified site: Secondary | ICD-10-CM | POA: Diagnosis not present

## 2024-07-17 DIAGNOSIS — M47817 Spondylosis without myelopathy or radiculopathy, lumbosacral region: Secondary | ICD-10-CM | POA: Diagnosis not present

## 2024-07-19 DIAGNOSIS — M4317 Spondylolisthesis, lumbosacral region: Secondary | ICD-10-CM | POA: Diagnosis not present

## 2024-07-19 DIAGNOSIS — M4155 Other secondary scoliosis, thoracolumbar region: Secondary | ICD-10-CM | POA: Diagnosis not present

## 2024-07-19 DIAGNOSIS — C189 Malignant neoplasm of colon, unspecified: Secondary | ICD-10-CM | POA: Diagnosis not present

## 2024-07-19 DIAGNOSIS — M47815 Spondylosis without myelopathy or radiculopathy, thoracolumbar region: Secondary | ICD-10-CM | POA: Diagnosis not present

## 2024-07-19 DIAGNOSIS — M47817 Spondylosis without myelopathy or radiculopathy, lumbosacral region: Secondary | ICD-10-CM | POA: Diagnosis not present

## 2024-07-19 DIAGNOSIS — C8370 Burkitt lymphoma, unspecified site: Secondary | ICD-10-CM | POA: Diagnosis not present

## 2024-07-26 DIAGNOSIS — M4155 Other secondary scoliosis, thoracolumbar region: Secondary | ICD-10-CM | POA: Diagnosis not present

## 2024-07-26 DIAGNOSIS — I10 Essential (primary) hypertension: Secondary | ICD-10-CM | POA: Diagnosis not present

## 2024-07-26 DIAGNOSIS — M810 Age-related osteoporosis without current pathological fracture: Secondary | ICD-10-CM | POA: Diagnosis not present

## 2024-07-26 DIAGNOSIS — C189 Malignant neoplasm of colon, unspecified: Secondary | ICD-10-CM | POA: Diagnosis not present

## 2024-07-26 DIAGNOSIS — M47815 Spondylosis without myelopathy or radiculopathy, thoracolumbar region: Secondary | ICD-10-CM | POA: Diagnosis not present

## 2024-07-26 DIAGNOSIS — R319 Hematuria, unspecified: Secondary | ICD-10-CM | POA: Diagnosis not present

## 2024-07-26 DIAGNOSIS — N390 Urinary tract infection, site not specified: Secondary | ICD-10-CM | POA: Diagnosis not present

## 2024-07-26 DIAGNOSIS — M47817 Spondylosis without myelopathy or radiculopathy, lumbosacral region: Secondary | ICD-10-CM | POA: Diagnosis not present

## 2024-07-26 DIAGNOSIS — C8370 Burkitt lymphoma, unspecified site: Secondary | ICD-10-CM | POA: Diagnosis not present

## 2024-07-26 DIAGNOSIS — M1990 Unspecified osteoarthritis, unspecified site: Secondary | ICD-10-CM | POA: Diagnosis not present

## 2024-07-26 DIAGNOSIS — M4317 Spondylolisthesis, lumbosacral region: Secondary | ICD-10-CM | POA: Diagnosis not present

## 2024-07-26 DIAGNOSIS — N200 Calculus of kidney: Secondary | ICD-10-CM | POA: Diagnosis not present

## 2024-07-28 DIAGNOSIS — M4317 Spondylolisthesis, lumbosacral region: Secondary | ICD-10-CM | POA: Diagnosis not present

## 2024-07-28 DIAGNOSIS — C189 Malignant neoplasm of colon, unspecified: Secondary | ICD-10-CM | POA: Diagnosis not present

## 2024-07-28 DIAGNOSIS — M47815 Spondylosis without myelopathy or radiculopathy, thoracolumbar region: Secondary | ICD-10-CM | POA: Diagnosis not present

## 2024-07-28 DIAGNOSIS — C8370 Burkitt lymphoma, unspecified site: Secondary | ICD-10-CM | POA: Diagnosis not present

## 2024-07-28 DIAGNOSIS — M4155 Other secondary scoliosis, thoracolumbar region: Secondary | ICD-10-CM | POA: Diagnosis not present

## 2024-07-28 DIAGNOSIS — M47817 Spondylosis without myelopathy or radiculopathy, lumbosacral region: Secondary | ICD-10-CM | POA: Diagnosis not present

## 2024-07-30 DIAGNOSIS — Z8781 Personal history of (healed) traumatic fracture: Secondary | ICD-10-CM | POA: Diagnosis not present

## 2024-07-30 DIAGNOSIS — Z556 Problems related to health literacy: Secondary | ICD-10-CM | POA: Diagnosis not present

## 2024-07-30 DIAGNOSIS — C189 Malignant neoplasm of colon, unspecified: Secondary | ICD-10-CM | POA: Diagnosis not present

## 2024-07-30 DIAGNOSIS — M1990 Unspecified osteoarthritis, unspecified site: Secondary | ICD-10-CM | POA: Diagnosis not present

## 2024-07-30 DIAGNOSIS — N390 Urinary tract infection, site not specified: Secondary | ICD-10-CM | POA: Diagnosis not present

## 2024-07-30 DIAGNOSIS — Z860101 Personal history of adenomatous and serrated colon polyps: Secondary | ICD-10-CM | POA: Diagnosis not present

## 2024-07-30 DIAGNOSIS — M4317 Spondylolisthesis, lumbosacral region: Secondary | ICD-10-CM | POA: Diagnosis not present

## 2024-07-30 DIAGNOSIS — Z9089 Acquired absence of other organs: Secondary | ICD-10-CM | POA: Diagnosis not present

## 2024-07-30 DIAGNOSIS — C8370 Burkitt lymphoma, unspecified site: Secondary | ICD-10-CM | POA: Diagnosis not present

## 2024-07-30 DIAGNOSIS — Z90710 Acquired absence of both cervix and uterus: Secondary | ICD-10-CM | POA: Diagnosis not present

## 2024-07-30 DIAGNOSIS — R32 Unspecified urinary incontinence: Secondary | ICD-10-CM | POA: Diagnosis not present

## 2024-07-30 DIAGNOSIS — I2510 Atherosclerotic heart disease of native coronary artery without angina pectoris: Secondary | ICD-10-CM | POA: Diagnosis not present

## 2024-07-30 DIAGNOSIS — Z8616 Personal history of COVID-19: Secondary | ICD-10-CM | POA: Diagnosis not present

## 2024-07-30 DIAGNOSIS — Z7982 Long term (current) use of aspirin: Secondary | ICD-10-CM | POA: Diagnosis not present

## 2024-07-30 DIAGNOSIS — M47817 Spondylosis without myelopathy or radiculopathy, lumbosacral region: Secondary | ICD-10-CM | POA: Diagnosis not present

## 2024-07-30 DIAGNOSIS — M810 Age-related osteoporosis without current pathological fracture: Secondary | ICD-10-CM | POA: Diagnosis not present

## 2024-07-30 DIAGNOSIS — M47815 Spondylosis without myelopathy or radiculopathy, thoracolumbar region: Secondary | ICD-10-CM | POA: Diagnosis not present

## 2024-07-30 DIAGNOSIS — N200 Calculus of kidney: Secondary | ICD-10-CM | POA: Diagnosis not present

## 2024-07-30 DIAGNOSIS — I10 Essential (primary) hypertension: Secondary | ICD-10-CM | POA: Diagnosis not present

## 2024-07-30 DIAGNOSIS — R319 Hematuria, unspecified: Secondary | ICD-10-CM | POA: Diagnosis not present

## 2024-07-30 DIAGNOSIS — I252 Old myocardial infarction: Secondary | ICD-10-CM | POA: Diagnosis not present

## 2024-07-30 DIAGNOSIS — E785 Hyperlipidemia, unspecified: Secondary | ICD-10-CM | POA: Diagnosis not present

## 2024-07-30 DIAGNOSIS — H9193 Unspecified hearing loss, bilateral: Secondary | ICD-10-CM | POA: Diagnosis not present

## 2024-07-30 DIAGNOSIS — M4155 Other secondary scoliosis, thoracolumbar region: Secondary | ICD-10-CM | POA: Diagnosis not present

## 2024-07-30 DIAGNOSIS — Z9049 Acquired absence of other specified parts of digestive tract: Secondary | ICD-10-CM | POA: Diagnosis not present

## 2024-07-31 DIAGNOSIS — M4155 Other secondary scoliosis, thoracolumbar region: Secondary | ICD-10-CM | POA: Diagnosis not present

## 2024-07-31 DIAGNOSIS — M4317 Spondylolisthesis, lumbosacral region: Secondary | ICD-10-CM | POA: Diagnosis not present

## 2024-07-31 DIAGNOSIS — M47815 Spondylosis without myelopathy or radiculopathy, thoracolumbar region: Secondary | ICD-10-CM | POA: Diagnosis not present

## 2024-07-31 DIAGNOSIS — M47817 Spondylosis without myelopathy or radiculopathy, lumbosacral region: Secondary | ICD-10-CM | POA: Diagnosis not present

## 2024-07-31 DIAGNOSIS — C8370 Burkitt lymphoma, unspecified site: Secondary | ICD-10-CM | POA: Diagnosis not present

## 2024-07-31 DIAGNOSIS — C189 Malignant neoplasm of colon, unspecified: Secondary | ICD-10-CM | POA: Diagnosis not present

## 2024-08-07 DIAGNOSIS — M4155 Other secondary scoliosis, thoracolumbar region: Secondary | ICD-10-CM | POA: Diagnosis not present

## 2024-08-07 DIAGNOSIS — M47817 Spondylosis without myelopathy or radiculopathy, lumbosacral region: Secondary | ICD-10-CM | POA: Diagnosis not present

## 2024-08-07 DIAGNOSIS — M4317 Spondylolisthesis, lumbosacral region: Secondary | ICD-10-CM | POA: Diagnosis not present

## 2024-08-07 DIAGNOSIS — C8370 Burkitt lymphoma, unspecified site: Secondary | ICD-10-CM | POA: Diagnosis not present

## 2024-08-07 DIAGNOSIS — M47815 Spondylosis without myelopathy or radiculopathy, thoracolumbar region: Secondary | ICD-10-CM | POA: Diagnosis not present

## 2024-08-07 DIAGNOSIS — C189 Malignant neoplasm of colon, unspecified: Secondary | ICD-10-CM | POA: Diagnosis not present

## 2024-08-11 DIAGNOSIS — C189 Malignant neoplasm of colon, unspecified: Secondary | ICD-10-CM | POA: Diagnosis not present

## 2024-08-11 DIAGNOSIS — M4317 Spondylolisthesis, lumbosacral region: Secondary | ICD-10-CM | POA: Diagnosis not present

## 2024-08-11 DIAGNOSIS — C8370 Burkitt lymphoma, unspecified site: Secondary | ICD-10-CM | POA: Diagnosis not present

## 2024-08-11 DIAGNOSIS — M47815 Spondylosis without myelopathy or radiculopathy, thoracolumbar region: Secondary | ICD-10-CM | POA: Diagnosis not present

## 2024-08-11 DIAGNOSIS — M47817 Spondylosis without myelopathy or radiculopathy, lumbosacral region: Secondary | ICD-10-CM | POA: Diagnosis not present

## 2024-08-11 DIAGNOSIS — M4155 Other secondary scoliosis, thoracolumbar region: Secondary | ICD-10-CM | POA: Diagnosis not present

## 2024-10-02 DIAGNOSIS — E785 Hyperlipidemia, unspecified: Secondary | ICD-10-CM | POA: Diagnosis not present

## 2024-10-02 DIAGNOSIS — I10 Essential (primary) hypertension: Secondary | ICD-10-CM | POA: Diagnosis not present

## 2024-10-02 DIAGNOSIS — E039 Hypothyroidism, unspecified: Secondary | ICD-10-CM | POA: Diagnosis not present

## 2024-10-09 DIAGNOSIS — N390 Urinary tract infection, site not specified: Secondary | ICD-10-CM | POA: Diagnosis not present

## 2024-11-23 ENCOUNTER — Emergency Department (HOSPITAL_BASED_OUTPATIENT_CLINIC_OR_DEPARTMENT_OTHER): Admitting: Radiology

## 2024-11-23 ENCOUNTER — Emergency Department (HOSPITAL_BASED_OUTPATIENT_CLINIC_OR_DEPARTMENT_OTHER)
Admission: EM | Admit: 2024-11-23 | Discharge: 2024-11-23 | Disposition: A | Discharge status: Home / Self Care | Attending: Emergency Medicine | Admitting: Emergency Medicine

## 2024-11-23 ENCOUNTER — Other Ambulatory Visit: Payer: Self-pay

## 2024-11-23 DIAGNOSIS — W19XXXA Unspecified fall, initial encounter: Secondary | ICD-10-CM | POA: Diagnosis not present

## 2024-11-23 DIAGNOSIS — Y9301 Activity, walking, marching and hiking: Secondary | ICD-10-CM | POA: Insufficient documentation

## 2024-11-23 DIAGNOSIS — Z79899 Other long term (current) drug therapy: Secondary | ICD-10-CM | POA: Insufficient documentation

## 2024-11-23 DIAGNOSIS — I10 Essential (primary) hypertension: Secondary | ICD-10-CM | POA: Diagnosis not present

## 2024-11-23 DIAGNOSIS — Z7982 Long term (current) use of aspirin: Secondary | ICD-10-CM | POA: Insufficient documentation

## 2024-11-23 DIAGNOSIS — I2510 Atherosclerotic heart disease of native coronary artery without angina pectoris: Secondary | ICD-10-CM | POA: Insufficient documentation

## 2024-11-23 DIAGNOSIS — Z859 Personal history of malignant neoplasm, unspecified: Secondary | ICD-10-CM | POA: Diagnosis not present

## 2024-11-23 DIAGNOSIS — M25551 Pain in right hip: Secondary | ICD-10-CM | POA: Diagnosis not present

## 2024-11-23 DIAGNOSIS — S79911A Unspecified injury of right hip, initial encounter: Secondary | ICD-10-CM | POA: Diagnosis present

## 2024-11-23 DIAGNOSIS — M4316 Spondylolisthesis, lumbar region: Secondary | ICD-10-CM | POA: Diagnosis not present

## 2024-11-23 DIAGNOSIS — S7001XA Contusion of right hip, initial encounter: Secondary | ICD-10-CM | POA: Diagnosis not present

## 2024-11-23 DIAGNOSIS — M4856XA Collapsed vertebra, not elsewhere classified, lumbar region, initial encounter for fracture: Secondary | ICD-10-CM | POA: Diagnosis not present

## 2024-11-23 DIAGNOSIS — M5136 Other intervertebral disc degeneration, lumbar region with discogenic back pain only: Secondary | ICD-10-CM | POA: Diagnosis not present

## 2024-11-23 DIAGNOSIS — M25751 Osteophyte, right hip: Secondary | ICD-10-CM | POA: Diagnosis not present

## 2024-11-23 DIAGNOSIS — M47816 Spondylosis without myelopathy or radiculopathy, lumbar region: Secondary | ICD-10-CM | POA: Diagnosis not present

## 2024-11-23 NOTE — ED Notes (Signed)
 DC paperwork given and verbally understood.

## 2024-11-23 NOTE — ED Notes (Signed)
 Patient transported to X-ray

## 2024-11-23 NOTE — ED Provider Notes (Signed)
 [place] EMERGENCY DEPARTMENT AT [facility] Provider Note   CSN: [id number] Arrival date & time: 11/23/24  1542     Patient presents with: [name] is a 89 y.o. female.   Patient mechanical fall while walking to the bathroom last night.  Did not hit her head or lose consciousness.  Some pain in her right hip.  Is not on any blood thinners.  She has history of CAD hypertension high cholesterol cancer history.  She has been ambulatory since the fall.  Denies any loss of bowel or bladder.  Denies any neck pain.  Denies any other extremity pain.  No prior injury to the hip in the past.  The history is provided by the patient.       Prior to Admission medications  Medication Sig Start Date End Date Taking? Authorizing Provider  aspirin 81 MG tablet Take 81 mg by mouth daily.    [provider]  Calcium Carbonate-Vitamin D 600-400 MG-UNIT per tablet Take 1 tablet by mouth 2 (two) times daily.     [provider]  denosumab  (PROLIA ) 60 MG/ML SOLN Inject 60 mg into the skin every 6 (six) months. Reported on 06/01/2016    [provider]  levothyroxine (SYNTHROID, LEVOTHROID) 25 MCG tablet Take 25 mg by mouth Daily. 04/12/12   [provider]  loperamide (IMODIUM A-D) 2 MG tablet Take 2 mg by mouth as needed. As needed (traveling)     [provider]  LORazepam (ATIVAN) 1 MG tablet Take 1 mg by mouth at bedtime as needed.      [provider]  metoprolol succinate (TOPROL-XL) 25 MG 24 hr tablet Take 25 mg by mouth daily.    [provider]  multivitamin [facility]) per tablet Take 1 tablet by mouth daily.      [provider]  nitroGLYCERIN  (NITROSTAT ) 0.4 MG SL tablet Place 1 tablet (0.4 mg total) under the tongue every 5 (five) minutes as needed for chest pain. 08/14/11   Bensimhon, Toribio SAUNDERS, MD  pantoprazole (PROTONIX) 40 MG tablet Take 40 mg by mouth Twice daily. 04/18/12   [provider]  rosuvastatin (CRESTOR) 20 MG tablet Take 20 mg by mouth daily.    [provider]    Allergies: Penicillins    Review of Systems  Updated Vital Signs BP (!) 148/84 (BP Location: Right Arm)   Pulse 65   Temp 97.9 F (36.6 C)   Resp 18   Ht 5' 1 (1.549 m)   Wt 47.6 kg   SpO2 94%   BMI 19.84 kg/m   Physical Exam Vitals and nursing note reviewed.  Constitutional:      General: She is not in acute distress.    Appearance: She is well-developed. She is not ill-appearing.  HENT:     Head: Normocephalic and atraumatic.     Nose: Nose normal.     Mouth/Throat:     Mouth: Mucous membranes are moist.  Eyes:     Extraocular Movements: Extraocular movements intact.     Conjunctiva/sclera: Conjunctivae normal.     Pupils: Pupils are equal, round, and reactive to light.  Cardiovascular:     Rate and Rhythm: Normal rate and regular rhythm.     Pulses: Normal pulses.     Heart sounds: Normal heart sounds. No murmur heard. Pulmonary:     Effort: Pulmonary effort is normal. No respiratory distress.     Breath sounds: Normal breath sounds.  Abdominal:     General: Abdomen is flat.     Palpations: Abdomen is soft.     Tenderness: There is no abdominal tenderness.  Musculoskeletal:        General: Tenderness present. No swelling.     Cervical back: Normal range of motion and neck supple.     Comments: Tenderness to the right hip, no midline spinal tenderness  Skin:    General: Skin is warm and dry.     Capillary Refill: Capillary refill takes less than 2 seconds.  Neurological:     General: No focal deficit present.     Mental Status: She is alert and oriented to person, place, and time.     Cranial Nerves: No cranial nerve deficit.     Sensory: No sensory deficit.     Motor: No weakness.     Coordination: Coordination normal.  Psychiatric:        Mood and Affect: Mood normal.     (all labs ordered are listed, but only abnormal results are displayed) Labs  Reviewed - No data to display  EKG: None  Radiology: DG Lumbar Spine Complete Result Date: 11/23/2024 EXAM: 4 VIEW(S) XRAY OF THE LUMBAR SPINE 11/23/2024 04:31:00 PM COMPARISON: None available. CLINICAL HISTORY: pain pain FINDINGS: LUMBAR SPINE: BONES: Vertebral body heights are maintained. Mild grade 1 anterolisthesis of L4-L5 and L5-S1 is noted secondary to facet joint hypertrophy. An old L1 compression fracture is noted. DISCS AND DEGENERATIVE CHANGES: Mild degenerative disc disease is noted at L4-L5 and L5-S1. SOFT TISSUES: No acute abnormality. IMPRESSION: 1. Mild grade 1 anterolisthesis of L4-5 and L5-S1 secondary to facet joint hypertrophy. 2. Old L1 compression fracture. 3. Mild degenerative disc disease at L4-5 and L5-S1. Electronically signed by: Lynwood Seip MD 11/23/2024 04:57 PM EST RP Workstation: [id number]   DG Hip Unilat With Pelvis 2-3 Views Right Result Date: 11/23/2024 EXAM: 2 or 3 VIEW(S) XRAY OF THE RIGHT HIP 11/23/2024 04:31:00 PM COMPARISON: None available. CLINICAL HISTORY: painm painm FINDINGS: BONES AND JOINTS: Minimal osteophyte formation of right hip is noted. SOFT TISSUES: The soft tissues are unremarkable. IMPRESSION: 1. Minimal osteophyte formation of the right hip, compatible with mild osteoarthritis. Electronically signed by: Lynwood Seip MD 11/23/2024 04:55 PM EST RP Workstation: [id number]     Procedures   Medications Ordered in the ED - No data to display                                  Medical Decision Making Amount and/or Complexity of Data Reviewed Radiology: ordered.   [name] is here after mechanical fall while walking to the bathroom last night.  She has been ambulatory since the fall.  Did not hit her head not on blood thinners.  She has got some pain to her right lateral hip but she is unable to flex and extend the right hip without any major discomfort.  She was ambulatory with her walker since the fall.  She is not having any pain  elsewhere.  She denies any loss of consciousness.  No neck pain no headache no upper extremity pain.  Ultimately will get an x-ray of her low back and right hip but suspect that she has a contusion given how well she is tolerating range of motion on exam.  She is neurovascular neuromuscular intact.  She took Tylenol prior to coming here and she is feeling better with.  X-ray per  radiology report shows no acute fracture or malalignment.  X-ray of the low back also unremarkable.  Clinically she is doing very well.  She is not really having any tenderness on my exam at this time.  She is able to flex extend at the hip without any major issues.  She has got great range of motion.  I talked with family and the patient about how sometimes we will pursue CT scan to get further bony detail but given her exam and reassuring x-ray I do think that we can hold off on that at this time.  They understand return if symptoms worsen.  Discharged in good condition.  Continue Tylenol lidocaine  patches ice and heat at home.  This chart was dictated using voice recognition software.  Despite best efforts to proofread,  errors can occur which can change the documentation meaning.      Final diagnoses:  Contusion of right hip, initial encounter    ED Discharge Orders     None          Ruthe Cornet, DO 11/23/24 1702

## 2024-11-23 NOTE — ED Triage Notes (Signed)
 Pt POV ambulatory reporting mechanical fall while walking from bathroom back to room last night, did not hit head, no LOC, reporting tenderness in R hip.

## 2024-11-23 NOTE — Discharge Instructions (Signed)
 Overall x-ray per radiology report does not show any acute fracture or broken bones.  I do think that she has bone bruise.  Continue lidocaine  patches Tylenol 1000 mg every 6 hours.  Recommend ice/heat as we discussed.  Please return if symptoms worsen.  Follow-up with your primary care doctor for further pain management.

## 2025-01-04 ENCOUNTER — Other Ambulatory Visit: Payer: Self-pay | Admitting: Internal Medicine

## 2025-01-04 DIAGNOSIS — M8000XA Age-related osteoporosis with current pathological fracture, unspecified site, initial encounter for fracture: Secondary | ICD-10-CM

## 2025-01-04 DIAGNOSIS — S22080A Wedge compression fracture of T11-T12 vertebra, initial encounter for closed fracture: Secondary | ICD-10-CM

## 2025-01-04 DIAGNOSIS — S32050A Wedge compression fracture of fifth lumbar vertebra, initial encounter for closed fracture: Secondary | ICD-10-CM

## 2025-01-05 ENCOUNTER — Ambulatory Visit: Admission: RE | Admit: 2025-01-05 | Source: Ambulatory Visit

## 2025-01-05 ENCOUNTER — Encounter: Payer: Self-pay | Admitting: Internal Medicine

## 2025-01-05 ENCOUNTER — Ambulatory Visit
Admission: RE | Admit: 2025-01-05 | Discharge: 2025-01-05 | Disposition: A | Source: Ambulatory Visit | Attending: Internal Medicine | Admitting: Internal Medicine

## 2025-01-05 DIAGNOSIS — S22080A Wedge compression fracture of T11-T12 vertebra, initial encounter for closed fracture: Secondary | ICD-10-CM

## 2025-01-05 DIAGNOSIS — S32050A Wedge compression fracture of fifth lumbar vertebra, initial encounter for closed fracture: Secondary | ICD-10-CM

## 2025-01-05 DIAGNOSIS — M8000XA Age-related osteoporosis with current pathological fracture, unspecified site, initial encounter for fracture: Secondary | ICD-10-CM

## 2025-01-09 ENCOUNTER — Other Ambulatory Visit: Payer: Self-pay | Admitting: Registered Nurse

## 2025-01-09 DIAGNOSIS — S32010G Wedge compression fracture of first lumbar vertebra, subsequent encounter for fracture with delayed healing: Secondary | ICD-10-CM

## 2025-01-09 DIAGNOSIS — M4854XG Collapsed vertebra, not elsewhere classified, thoracic region, subsequent encounter for fracture with delayed healing: Secondary | ICD-10-CM

## 2025-01-09 NOTE — Progress Notes (Signed)
 "     Chief Complaint: Patient was seen in consultation today for thoracic and lumbar compression fractures.   Referring Physician(s): Turbeville,Brittany Dabbs  History of Present Illness: [name] is a 89 y.o. female with a medical history significant for HTN, CAD with NSTEMI in 2007 (s/p stent placements; daily aspirin), colon cancer, Burkitt lymphoma, osteoporosis, scoliosis and compression fractures. She has had a series of falls resulting in worsening back pain. Her most recent fall was January 11 and she has suffered unbearable back pain since then. She is unable to find a comfortable position, her mobility has significantly declined and she has had unintentional weight loss. She has tried tylenol, tramadol and applying a heating pad to her lower back. She was evaluated by her PCP and MRI studies of the spine were ordered. These studies showed chronic T11 and subacute/chronic L1 compression fractures.  The patient has been referred to Interventional Radiology for possible kyphoplasty.    Past Medical History:  Diagnosis Date   Burkitt lymphoma (HCC)    Cancer (HCC)    lymphomia, s/p chemotherapy   Colon cancer [facility])    s/p resection, currently being treated with chemotherapy   Coronary artery disease    s/p NSTEMI in Jan 2007, with subsequent stenting of the prox LAD with a Taxus DES, stable   Heart attack (HCC)    Hyperlipidemia    Hypertension    Status post chemotherapy     Past Surgical History:  Procedure Laterality Date   ANGIOPLASTY  12/2006   with taxus stent   LAPAROSCOPY     to stage lymphoma   PARTIAL COLECTOMY     VAGINAL HYSTERECTOMY      Allergies: Penicillins  Medications: Prior to Admission medications  Medication Sig Start Date End Date Taking? Authorizing Provider  aspirin 81 MG tablet Take 81 mg by mouth daily.    [provider]  Calcium Carbonate-Vitamin D 600-400 MG-UNIT per tablet Take 1 tablet by mouth 2 (two) times daily.      [provider]  denosumab  (PROLIA ) 60 MG/ML SOLN Inject 60 mg into the skin every 6 (six) months. Reported on 06/01/2016    [provider]  levothyroxine (SYNTHROID, LEVOTHROID) 25 MCG tablet Take 25 mg by mouth Daily. 04/12/12   [provider]  loperamide (IMODIUM A-D) 2 MG tablet Take 2 mg by mouth as needed. As needed (traveling)     [provider]  LORazepam (ATIVAN) 1 MG tablet Take 1 mg by mouth at bedtime as needed.      [provider]  metoprolol succinate (TOPROL-XL) 25 MG 24 hr tablet Take 25 mg by mouth daily.    [provider]  multivitamin [facility]) per tablet Take 1 tablet by mouth daily.      [provider]  nitroGLYCERIN  (NITROSTAT ) 0.4 MG SL tablet Place 1 tablet (0.4 mg total) under the tongue every 5 (five) minutes as needed for chest pain. 08/14/11   Bensimhon, Toribio SAUNDERS, MD  pantoprazole (PROTONIX) 40 MG tablet Take 40 mg by mouth Twice daily. 04/18/12   [provider]  rosuvastatin (CRESTOR) 20 MG tablet Take 20 mg by mouth daily.    [provider]     Family History  Problem Relation Age of Onset   Heart disease Mother     Social History   Socioeconomic History   Marital status: Married    Spouse name: Not on file   Number of children: 4  Years of education: Not on file   Highest education level: Not on file  Occupational History   Occupation: retired  Tobacco Use   Smoking status: Former   Smokeless tobacco: Never  Substance and Sexual Activity   Alcohol  use: Yes    Comment: 1-2 per day   Drug use: No   Sexual activity: Not on file  Other Topics Concern   Not on file  Social History Narrative   Not on file   Social Drivers of Health   Tobacco Use: Low Risk (10/08/2023)   Received from [facility]   Patient History    Smoking Tobacco Use: Never    Smokeless Tobacco Use: Never    Passive Exposure: Not on file  Financial Resource Strain: Not on file  Food  Insecurity: Not on file  Transportation Needs: Not on file  Physical Activity: Not on file  Stress: Not on file  Social Connections: Not on file  Depression (EYV7-0): Not on file  Alcohol  Screen: Not on file  Housing: Not on file  Utilities: Not on file  Health Literacy: Not on file     Review of Systems: A 12 point ROS discussed and pertinent positives are indicated in the HPI above.  All other systems are negative.  Review of Systems  Vital Signs: There were no vitals taken for this visit.  Advance Care Plan: The advanced care plan/surrogate decision maker was discussed at the time of visit and documented in the medical record.    Physical Exam  Imaging: MR Thoracic Spine 01/05/25  IMPRESSION: 1. No acute abnormality in the thoracic spine. 2. Chronic 30% superior endplate compression fracture at T11 with 4 mm posterior bony retropulsion. 3. Multilevel thoracic spondylosis with disc desiccation and multilevel loss of disc height, including type 2 degenerative endplate changes at T11-T12 and mild grade 1 degenerative anterolisthesis at T3-4 and T4-5. 4. Additional degenerative changes without impingement, including mild disc bulge and right facet arthropathy at T1-2, mild right eccentric disc bulge at T2-3, central disc protrusion and disc uncovering at T3-4, disc bulge with right greater than left facet arthropathy at T4-5, left eccentric disc bulge with mild bilateral facet arthropathy at T5-6, mild disc bulges at T6-7, T7-8 (left eccentric), T8-9, T9-10, T11-12, and T12-L1. 5. Large hiatal hernia.   MR Lumbar Spine 01/05/25   IMPRESSION: 1. 50% compression fracture at L1 with 5 mm posterior bony retropulsion and mild inferior endplate edema, compatible with subacute to chronic chronicity. 2. Linear signal abnormality in the right sacral ala, suspicious for a right sacral alar fracture. 3. Lumbar spondylosis and degenerative disc disease, causing  moderate impingement at L4-5. 4. Degenerative listhesis, including 3 mm retrolisthesis at T12-L1, 8 mm anterolisthesis at L4-5, and 9 mm anterolisthesis at L5-S1. 5. Degenerative disc and facet changes, including disc bulge at T12-L1; left paracentral disc protrusion at L1-2; disc bulge and right lateral recess disc protrusion at L2-3 and L3-4; and substantial disc uncovering with facet arthropathy at L5-S1.   Labs:  CBC: No results for input(s): WBC, HGB, HCT, PLT in the last 8760 hours.  COAGS: No results for input(s): INR, APTT in the last 8760 hours.  BMP: No results for input(s): NA, K, CL, CO2, GLUCOSE, BUN, CALCIUM, CREATININE, GFRNONAA, GFRAA in the last 8760 hours.  Invalid input(s): CMP  LIVER FUNCTION TESTS: No results for input(s): BILITOT, AST, ALT, ALKPHOS, PROT, ALBUMIN in the last 8760 hours.  TUMOR MARKERS: No results for input(s): AFPTM, CEA, CA199, CHROMGRNA in the  last 8760 hours.  Assessment and Plan:  89 year old female with a history of thoracic and lumbar compression fractures.   Thank you for this interesting consult.  I greatly enjoyed meeting [name] and look forward to participating in their care.  A copy of this report was sent to the requesting provider on this date.  Ester Sides, MD Pager: [phone]    I spent a total of  40 Minutes   in face to face in clinical consultation, greater than 50% of which was counseling/coordinating care for thoracic and lumbar compression fractures.  "

## 2025-01-10 ENCOUNTER — Inpatient Hospital Stay
Admission: RE | Admit: 2025-01-10 | Discharge: 2025-01-10 | Disposition: A | Source: Ambulatory Visit | Attending: Registered Nurse | Admitting: Registered Nurse

## 2025-01-10 ENCOUNTER — Other Ambulatory Visit: Payer: Self-pay | Admitting: Interventional Radiology

## 2025-01-10 DIAGNOSIS — M545 Low back pain, unspecified: Secondary | ICD-10-CM

## 2025-01-10 DIAGNOSIS — M8008XA Age-related osteoporosis with current pathological fracture, vertebra(e), initial encounter for fracture: Secondary | ICD-10-CM

## 2025-01-10 DIAGNOSIS — M4854XG Collapsed vertebra, not elsewhere classified, thoracic region, subsequent encounter for fracture with delayed healing: Secondary | ICD-10-CM

## 2025-01-10 DIAGNOSIS — S32010G Wedge compression fracture of first lumbar vertebra, subsequent encounter for fracture with delayed healing: Secondary | ICD-10-CM

## 2025-01-10 DIAGNOSIS — M810 Age-related osteoporosis without current pathological fracture: Secondary | ICD-10-CM

## 2025-01-11 ENCOUNTER — Other Ambulatory Visit: Payer: Self-pay | Admitting: Internal Medicine

## 2025-01-11 DIAGNOSIS — S32050A Wedge compression fracture of fifth lumbar vertebra, initial encounter for closed fracture: Secondary | ICD-10-CM

## 2025-01-11 DIAGNOSIS — S22080A Wedge compression fracture of T11-T12 vertebra, initial encounter for closed fracture: Secondary | ICD-10-CM

## 2025-01-17 ENCOUNTER — Telehealth: Payer: Self-pay

## 2025-01-17 NOTE — Telephone Encounter (Signed)
 See telephone note

## 2025-01-17 NOTE — Discharge Instructions (Signed)
 Discharge Instructions for Kyphoplasty/Sacroplasty  You may resume a regular diet and any medications that you routinely take (including pain medications). No driving the day of the procedure. Upon discharge go home and rest. Slowly increase your activities as tolerated. Do not lift anything heavier than a jug of milk (roughly eight pounds) for two weeks. You may use an ice pack as needed to the injection sites. You may change the dressing to bandaids tomorrow.  Change the bandaids daily until sites have healed. Follow up with your doctor in 2-3 weeks to let him know how you are feeling. Discuss with your doctor if bone-building therapy is appropriate for you, if you are not on this type of medication already.    Please contact our office at [phone] for the following symptoms or any questions:  Fever greater than 100 degrees Increased swelling, pain, or redness at injection site.   If you need to speak with someone after business hours, please call the Interventional Radiologist on-call service at [phone]. Tell them you are Dr. Terrill patient and that you had a Kyphoplasty/Sacroplasty today, and let them know any issues you are having.   Thank you for visiting [facility]!

## 2025-01-17 NOTE — Telephone Encounter (Signed)
 See telephone note  Son states they were not informed to hold baby ASA for 5 days, this RN reached out to Dr. Jennefer, ok to proceed with procedure on Friday, if pt able to hold asa as of today (son states pt takes asa at night), phone call placed back to son, Lex @ [phone], message left to hold asa as of today and ok for procedure on 01-19-25  Phone # [phone] given to son to return call if need be  Also this RN reached out [facility] [phone]) for updated labs from January 2026, noted they were done, but unable to see results, message with [facility] fax number given

## 2025-01-18 NOTE — Progress Notes (Shared)
 "     Chief Complaint: Patient was seen in consultation today for L1 compression fracture  Referring Physician(s): Turbeville,Brittany Dabbs   Patient Status: [facility] [place] - Outpatient  History of Present Illness: [name] is a 89 y.o. female with a medical history significant for HTN, CAD with NSTEMI in 2007 (s/p stent placements; daily aspirin), colon cancer, Burkitt lymphoma, osteoporosis, scoliosis and compression fractures. She has had a series of falls resulting in worsening back pain. Her most recent fall was January 11 and she has suffered unbearable back pain since then. She is unable to find a comfortable position, her mobility has significantly declined and she has had unintentional weight loss. She has tried tylenol , tramadol and applying a heating pad to her lower back. She was evaluated by her PCP and MRI studies of the spine were ordered. These studies showed chronic T11 and subacute L1 compression fractures.   She was referred to Interventional Radiology for kyphoplasty and we met in consultation 01/10/25. She reported that she used to be a very avid marine scientist and skier, and up until several months ago was walking 2-3 miles per day. She can no longer do this due to her pain. She denied lower extremity weakness or numbness. We discussed performing a kyphoplasty at the L1 compression fracture. We reviewed the risks, benefits, alternatives and procedural expectations. She was agreeable to proceed.   She presents to the clinic today for an L1 kyphoplasty. There have been no changes in her symptoms.   Past Medical History:  Diagnosis Date   Burkitt lymphoma (HCC)    Cancer (HCC)    lymphomia, s/p chemotherapy   Colon cancer [facility])    s/p resection, currently being treated with chemotherapy   Coronary artery disease    s/p NSTEMI in Jan 2007, with subsequent stenting of the prox LAD with a Taxus DES, stable   Heart attack (HCC)    Hyperlipidemia    Hypertension    Status post  chemotherapy     Past Surgical History:  Procedure Laterality Date   ANGIOPLASTY  12/2006   with taxus stent   IR RADIOLOGIST EVAL & MGMT  01/10/2025   LAPAROSCOPY     to stage lymphoma   PARTIAL COLECTOMY     VAGINAL HYSTERECTOMY      Allergies: Penicillins  Medications: Prior to Admission medications  Medication Sig Start Date End Date Taking? Authorizing Provider  aspirin 81 MG tablet Take 81 mg by mouth daily.    [provider]  Calcium Carbonate-Vitamin D 600-400 MG-UNIT per tablet Take 1 tablet by mouth 2 (two) times daily.     [provider]  denosumab  (PROLIA ) 60 MG/ML SOLN Inject 60 mg into the skin every 6 (six) months. Reported on 06/01/2016    [provider]  levothyroxine (SYNTHROID, LEVOTHROID) 25 MCG tablet Take 25 mg by mouth Daily. 04/12/12   [provider]  loperamide (IMODIUM A-D) 2 MG tablet Take 2 mg by mouth as needed. As needed (traveling)     [provider]  LORazepam (ATIVAN) 1 MG tablet Take 1 mg by mouth at bedtime as needed.      [provider]  metoprolol succinate (TOPROL-XL) 25 MG 24 hr tablet Take 25 mg by mouth daily.    [provider]  multivitamin [facility]) per tablet Take 1 tablet by mouth daily.      [provider]  nitroGLYCERIN  (NITROSTAT ) 0.4 MG SL tablet Place 1 tablet (0.4 mg total) under  the tongue every 5 (five) minutes as needed for chest pain. 08/14/11   Bensimhon, Toribio SAUNDERS, MD  pantoprazole (PROTONIX) 40 MG tablet Take 40 mg by mouth Twice daily. 04/18/12   [provider]  rosuvastatin (CRESTOR) 20 MG tablet Take 20 mg by mouth daily.    [provider]     Family History  Problem Relation Age of Onset   Heart disease Mother     Social History   Socioeconomic History   Marital status: Married    Spouse name: Not on file   Number of children: 4   Years of education: Not on file   Highest education level: Not on file  Occupational  History   Occupation: retired  Tobacco Use   Smoking status: Former   Smokeless tobacco: Never  Substance and Sexual Activity   Alcohol  use: Yes    Comment: 1-2 per day   Drug use: No   Sexual activity: Not on file  Other Topics Concern   Not on file  Social History Narrative   Not on file   Social Drivers of Health   Tobacco Use: Medium Risk (01/10/2025)   Patient History    Smoking Tobacco Use: Former    Smokeless Tobacco Use: Never    Passive Exposure: Not on Actuary Strain: Not on file  Food Insecurity: Not on file  Transportation Needs: Not on file  Physical Activity: Not on file  Stress: Not on file  Social Connections: Not on file  Depression (EYV7-0): Not on file  Alcohol  Screen: Not on file  Housing: Not on file  Utilities: Not on file  Health Literacy: Not on file    Review of Systems: A 12 point ROS discussed and pertinent positives are indicated in the HPI above.  All other systems are negative.   Vital Signs: There were no vitals taken for this visit.  Physical Exam Constitutional:      General: She is not in acute distress.    Appearance: She is not ill-appearing.  HENT:     Mouth/Throat:     Mouth: Mucous membranes are moist.     Pharynx: Oropharynx is clear.  Cardiovascular:     Rate and Rhythm: Normal rate.  Pulmonary:     Effort: Pulmonary effort is normal.  Abdominal:     Tenderness: There is no abdominal tenderness.  Musculoskeletal:     Comments: Low back pain  Skin:    General: Skin is warm and dry.  Neurological:     Mental Status: She is alert and oriented to person, place, and time.  Psychiatric:        Mood and Affect: Mood normal.        Behavior: Behavior normal.        Thought Content: Thought content normal.        Judgment: Judgment normal.     Labs:  CBC: No results for input(s): WBC, HGB, HCT, PLT in the last 8760 hours.  COAGS: No results for input(s): INR, APTT in the last 8760  hours.  BMP: No results for input(s): NA, K, CL, CO2, GLUCOSE, BUN, CALCIUM, CREATININE, GFRNONAA, GFRAA in the last 8760 hours.  Invalid input(s): CMP  LIVER FUNCTION TESTS: No results for input(s): BILITOT, AST, ALT, ALKPHOS, PROT, ALBUMIN in the last 8760 hours.  TUMOR MARKERS: No results for input(s): AFPTM, CEA, CA199, CHROMGRNA in the last 8760 hours.  Assessment and Plan:  L1 compression fracture: Anhar Mcdermott. [place], 89 year old female,  presents today for an image-guided L1 kyphoplasty/vertebroplasty.  Risks and benefits of L1 kyphoplasty/vertebroplasty were discussed with the patient including, but not limited to education regarding the natural healing process of compression fractures without intervention, bleeding, infection, cement migration which may cause spinal cord damage, paralysis, pulmonary embolism or even death.  All of the patient's questions were answered, patient is agreeable to proceed. She has been NPO. Aspirin has been held for five days.   Consent signed and in chart.  Thank you for this interesting consult.  I greatly enjoyed meeting RHYANNA SORCE and look forward to participating in their care.  A copy of this report was sent to the requesting provider on this date.  Ester Sides, MD Pager: [phone]    I spent a total of  30 Minutes   in face to face in clinical consultation, greater than 50% of which was counseling/coordinating care for L1 compression fracture.  "

## 2025-01-19 ENCOUNTER — Telehealth: Payer: Self-pay

## 2025-01-19 ENCOUNTER — Inpatient Hospital Stay: Admission: RE | Admit: 2025-01-19 | Source: Ambulatory Visit

## 2025-01-19 DIAGNOSIS — M545 Low back pain, unspecified: Secondary | ICD-10-CM

## 2025-01-19 DIAGNOSIS — M810 Age-related osteoporosis without current pathological fracture: Secondary | ICD-10-CM

## 2025-01-19 DIAGNOSIS — S32010G Wedge compression fracture of first lumbar vertebra, subsequent encounter for fracture with delayed healing: Secondary | ICD-10-CM

## 2025-01-19 DIAGNOSIS — M8008XA Age-related osteoporosis with current pathological fracture, vertebra(e), initial encounter for fracture: Secondary | ICD-10-CM

## 2025-01-19 MED ORDER — VANCOMYCIN HCL IN DEXTROSE 1-5 GM/200ML-% IV SOLN
1000.0000 mg | INTRAVENOUS | Status: AC
  Administered 2025-01-19: 1000 mg via INTRAVENOUS

## 2025-01-19 MED ORDER — ACETAMINOPHEN 10 MG/ML IV SOLN
1000.0000 mg | Freq: Once | INTRAVENOUS | Status: AC
  Administered 2025-01-19: 1000 mg via INTRAVENOUS

## 2025-01-19 MED ORDER — KETOROLAC TROMETHAMINE 30 MG/ML IJ SOLN: 30.0000 mg | Freq: Once | INTRAMUSCULAR | Status: AC

## 2025-01-19 MED ORDER — FENTANYL CITRATE (PF) 100 MCG/2ML IJ SOLN
INTRAMUSCULAR | Status: AC | PRN
  Administered 2025-01-19: 25 ug via INTRAVENOUS
  Administered 2025-01-19: 50 ug via INTRAVENOUS

## 2025-01-19 MED ORDER — LIDOCAINE HCL (PF) 1 % IJ SOLN
20.0000 mL | Freq: Once | INTRAMUSCULAR | Status: AC
  Administered 2025-01-19: 20 mL via INTRADERMAL

## 2025-01-19 MED ORDER — FENTANYL CITRATE (PF) 50 MCG/ML IJ SOSY: 25.0000 ug | PREFILLED_SYRINGE | INTRAMUSCULAR | Status: AC | PRN

## 2025-01-19 MED ORDER — MIDAZOLAM HCL (PF) 2 MG/2ML IJ SOLN
INTRAMUSCULAR | Status: AC | PRN
  Administered 2025-01-19: 1 mg via INTRAVENOUS

## 2025-01-19 MED ORDER — SODIUM CHLORIDE 0.9 % IV SOLN: INTRAVENOUS | Status: AC

## 2025-01-19 MED ORDER — MIDAZOLAM HCL (PF) 2 MG/2ML IJ SOLN: 1.0000 mg | INTRAMUSCULAR | Status: AC | PRN

## 2025-01-19 NOTE — Procedures (Signed)
 Interventional Radiology Procedure Note  Procedure: L1 kyphoplasty  Findings: Please refer to procedural dictation for full description.  Complications: None immediate  Estimated Blood Loss: < 5 mL  Recommendations: IR will arrange 3-4 week follow up.   Ester Sides, MD

## 2025-01-19 NOTE — Discharge Instructions (Signed)
 Kyphoplasty Post Procedure Discharge Instructions  May resume a regular diet and any medications that you routinely take (including pain medications). However, if you are taking Aspirin or an anticoagulant/blood thinner you will be told when you can resume taking these by the healthcare provider. No driving day of procedure. The day of your procedure take it easy. You may use an ice pack as needed to injection sites on back.  Ice to back 30 minutes on and 30 minutes off, as needed. May remove bandaids tomorrow after taking a shower. Replace daily with a clean bandaid until healed.  Do not lift anything heavier than a milk jug for 1-2 weeks or determined by your physician.  Follow up with your physician in 2 weeks.  If you need to speak to someone after hours, please call the on call IR physician at [phone].  Tell them you are a patient of Dr. Jennefer and that you had a Kyphoplasty today and the issues you are having.   Please contact our office at [phone]  for the following symptoms or if you have any questions:  Fever greater than 100 degrees Increased swelling, pain, or redness at injection site. Increased back and/or leg pain New numbness or change in symptoms from before the procedure.    Thank you for visiting [facility]!
